# Patient Record
Sex: Female | Born: 1965
Health system: Southern US, Community
[De-identification: ages and names within clinical notes are randomized; demographics above are authoritative.]

## PROBLEM LIST (undated history)

## (undated) DIAGNOSIS — K589 Irritable bowel syndrome without diarrhea: Secondary | ICD-10-CM

## (undated) DIAGNOSIS — E785 Hyperlipidemia, unspecified: Secondary | ICD-10-CM

## (undated) DIAGNOSIS — N390 Urinary tract infection, site not specified: Secondary | ICD-10-CM

## (undated) DIAGNOSIS — G250 Essential tremor: Secondary | ICD-10-CM

## (undated) DIAGNOSIS — K219 Gastro-esophageal reflux disease without esophagitis: Secondary | ICD-10-CM

## (undated) DIAGNOSIS — E7800 Pure hypercholesterolemia, unspecified: Secondary | ICD-10-CM

## (undated) DIAGNOSIS — I6529 Occlusion and stenosis of unspecified carotid artery: Secondary | ICD-10-CM

## (undated) HISTORY — DX: Hyperlipidemia, unspecified: E78.5

## (undated) HISTORY — PX: EYE SURGERY: SHX253

## (undated) HISTORY — PX: BREAST CYST ASPIRATION: SHX578

## (undated) HISTORY — DX: Pure hypercholesterolemia, unspecified: E78.00

## (undated) HISTORY — DX: Gastro-esophageal reflux disease without esophagitis: K21.9

## (undated) HISTORY — DX: Urinary tract infection, site not specified: N39.0

## (undated) HISTORY — DX: Irritable bowel syndrome, unspecified: K58.9

## (undated) HISTORY — DX: Occlusion and stenosis of unspecified carotid artery: I65.29

## (undated) HISTORY — DX: Essential tremor: G25.0

---

## 1998-05-17 ENCOUNTER — Other Ambulatory Visit: Admission: RE | Admit: 1998-05-17 | Discharge: 1998-05-17 | Payer: Self-pay | Admitting: *Deleted

## 1999-09-22 ENCOUNTER — Other Ambulatory Visit: Admission: RE | Admit: 1999-09-22 | Discharge: 1999-09-22 | Payer: Self-pay | Admitting: Obstetrics and Gynecology

## 2000-10-10 ENCOUNTER — Encounter: Payer: Self-pay | Admitting: Obstetrics and Gynecology

## 2000-10-10 ENCOUNTER — Encounter: Admission: RE | Admit: 2000-10-10 | Discharge: 2000-10-10 | Payer: Self-pay | Admitting: Obstetrics and Gynecology

## 2000-11-23 ENCOUNTER — Other Ambulatory Visit: Admission: RE | Admit: 2000-11-23 | Discharge: 2000-11-23 | Payer: Self-pay | Admitting: Obstetrics and Gynecology

## 2001-09-17 ENCOUNTER — Ambulatory Visit (HOSPITAL_COMMUNITY): Admission: RE | Admit: 2001-09-17 | Discharge: 2001-09-17 | Payer: Self-pay | Admitting: Orthopedic Surgery

## 2001-09-17 ENCOUNTER — Encounter: Payer: Self-pay | Admitting: Orthopedic Surgery

## 2001-12-19 ENCOUNTER — Other Ambulatory Visit: Admission: RE | Admit: 2001-12-19 | Discharge: 2001-12-19 | Payer: Self-pay | Admitting: Obstetrics and Gynecology

## 2002-03-21 ENCOUNTER — Encounter: Admission: RE | Admit: 2002-03-21 | Discharge: 2002-03-21 | Payer: Self-pay | Admitting: Obstetrics and Gynecology

## 2002-03-21 ENCOUNTER — Encounter: Payer: Self-pay | Admitting: Obstetrics and Gynecology

## 2003-01-02 ENCOUNTER — Other Ambulatory Visit: Admission: RE | Admit: 2003-01-02 | Discharge: 2003-01-02 | Payer: Self-pay | Admitting: Obstetrics and Gynecology

## 2003-02-03 ENCOUNTER — Encounter: Payer: Self-pay | Admitting: Obstetrics and Gynecology

## 2003-02-03 ENCOUNTER — Encounter: Admission: RE | Admit: 2003-02-03 | Discharge: 2003-02-03 | Payer: Self-pay | Admitting: Obstetrics and Gynecology

## 2004-01-15 ENCOUNTER — Other Ambulatory Visit: Admission: RE | Admit: 2004-01-15 | Discharge: 2004-01-15 | Payer: Self-pay | Admitting: Obstetrics and Gynecology

## 2004-03-29 ENCOUNTER — Encounter: Admission: RE | Admit: 2004-03-29 | Discharge: 2004-03-29 | Payer: Self-pay | Admitting: Internal Medicine

## 2004-04-12 ENCOUNTER — Encounter: Admission: RE | Admit: 2004-04-12 | Discharge: 2004-04-12 | Payer: Self-pay | Admitting: Obstetrics and Gynecology

## 2005-03-17 ENCOUNTER — Other Ambulatory Visit: Admission: RE | Admit: 2005-03-17 | Discharge: 2005-03-17 | Payer: Self-pay | Admitting: Obstetrics and Gynecology

## 2005-04-04 ENCOUNTER — Encounter: Admission: RE | Admit: 2005-04-04 | Discharge: 2005-04-04 | Payer: Self-pay | Admitting: *Deleted

## 2005-05-23 ENCOUNTER — Encounter: Admission: RE | Admit: 2005-05-23 | Discharge: 2005-05-23 | Payer: Self-pay | Admitting: Obstetrics and Gynecology

## 2006-05-28 ENCOUNTER — Encounter: Admission: RE | Admit: 2006-05-28 | Discharge: 2006-05-28 | Payer: Self-pay | Admitting: Obstetrics and Gynecology

## 2007-06-21 ENCOUNTER — Encounter: Admission: RE | Admit: 2007-06-21 | Discharge: 2007-06-21 | Payer: Self-pay | Admitting: Obstetrics and Gynecology

## 2008-09-08 ENCOUNTER — Encounter: Admission: RE | Admit: 2008-09-08 | Discharge: 2008-09-08 | Payer: Self-pay | Admitting: Obstetrics and Gynecology

## 2008-09-28 ENCOUNTER — Encounter: Admission: RE | Admit: 2008-09-28 | Discharge: 2008-09-28 | Payer: Self-pay | Admitting: Obstetrics and Gynecology

## 2009-10-05 ENCOUNTER — Encounter: Admission: RE | Admit: 2009-10-05 | Discharge: 2009-10-05 | Payer: Self-pay | Admitting: Obstetrics and Gynecology

## 2010-10-06 ENCOUNTER — Encounter
Admission: RE | Admit: 2010-10-06 | Discharge: 2010-10-06 | Payer: Self-pay | Source: Home / Self Care | Attending: Obstetrics and Gynecology | Admitting: Obstetrics and Gynecology

## 2010-10-30 ENCOUNTER — Encounter: Payer: Self-pay | Admitting: Obstetrics and Gynecology

## 2011-11-08 ENCOUNTER — Other Ambulatory Visit: Payer: Self-pay | Admitting: Obstetrics and Gynecology

## 2011-11-08 DIAGNOSIS — Z1231 Encounter for screening mammogram for malignant neoplasm of breast: Secondary | ICD-10-CM

## 2011-12-01 ENCOUNTER — Other Ambulatory Visit: Payer: Self-pay | Admitting: Obstetrics and Gynecology

## 2011-12-01 ENCOUNTER — Ambulatory Visit
Admission: RE | Admit: 2011-12-01 | Discharge: 2011-12-01 | Disposition: A | Discharge status: Home / Self Care | Payer: Commercial Indemnity | Source: Ambulatory Visit | Attending: Obstetrics and Gynecology | Admitting: Obstetrics and Gynecology

## 2011-12-01 DIAGNOSIS — N63 Unspecified lump in unspecified breast: Secondary | ICD-10-CM

## 2011-12-01 DIAGNOSIS — Z1231 Encounter for screening mammogram for malignant neoplasm of breast: Secondary | ICD-10-CM

## 2011-12-12 ENCOUNTER — Ambulatory Visit
Admission: RE | Admit: 2011-12-12 | Discharge: 2011-12-12 | Disposition: A | Discharge status: Home / Self Care | Payer: Commercial Indemnity | Source: Ambulatory Visit | Attending: Obstetrics and Gynecology | Admitting: Obstetrics and Gynecology

## 2011-12-12 DIAGNOSIS — N63 Unspecified lump in unspecified breast: Secondary | ICD-10-CM

## 2012-12-06 ENCOUNTER — Other Ambulatory Visit: Payer: Self-pay

## 2012-12-06 DIAGNOSIS — Z1231 Encounter for screening mammogram for malignant neoplasm of breast: Secondary | ICD-10-CM

## 2013-01-03 ENCOUNTER — Ambulatory Visit
Admission: RE | Admit: 2013-01-03 | Discharge: 2013-01-03 | Disposition: A | Discharge status: Home / Self Care | Payer: BC Managed Care – PPO | Source: Ambulatory Visit

## 2013-01-03 DIAGNOSIS — Z1231 Encounter for screening mammogram for malignant neoplasm of breast: Secondary | ICD-10-CM

## 2014-01-28 ENCOUNTER — Other Ambulatory Visit: Payer: Self-pay

## 2014-01-28 DIAGNOSIS — Z803 Family history of malignant neoplasm of breast: Secondary | ICD-10-CM

## 2014-01-28 DIAGNOSIS — Z1231 Encounter for screening mammogram for malignant neoplasm of breast: Secondary | ICD-10-CM

## 2014-03-18 ENCOUNTER — Ambulatory Visit
Admission: RE | Admit: 2014-03-18 | Discharge: 2014-03-18 | Disposition: A | Discharge status: Home / Self Care | Payer: BC Managed Care – PPO | Source: Ambulatory Visit

## 2014-03-18 ENCOUNTER — Other Ambulatory Visit: Payer: Self-pay

## 2014-03-18 DIAGNOSIS — Z1231 Encounter for screening mammogram for malignant neoplasm of breast: Secondary | ICD-10-CM

## 2014-03-18 DIAGNOSIS — Z803 Family history of malignant neoplasm of breast: Secondary | ICD-10-CM

## 2014-12-04 ENCOUNTER — Ambulatory Visit (INDEPENDENT_AMBULATORY_CARE_PROVIDER_SITE_OTHER): Payer: BLUE CROSS/BLUE SHIELD | Admitting: Cardiology

## 2014-12-04 ENCOUNTER — Encounter: Payer: Self-pay | Admitting: Cardiology

## 2014-12-04 VITALS — BP 138/80 | HR 85 | Ht 61.0 in | Wt 128.4 lb

## 2014-12-04 DIAGNOSIS — R002 Palpitations: Secondary | ICD-10-CM | POA: Insufficient documentation

## 2014-12-04 DIAGNOSIS — R0789 Other chest pain: Secondary | ICD-10-CM

## 2014-12-04 NOTE — Progress Notes (Signed)
Cardiology Office Note   Date:  12/04/2014   ID:  Katelyn Cooke 31-Dec-1965, MRN [id number]  PCP:  No primary care provider on file.  Cardiologist:   Minus Breeding, MD   Chief Complaint  Patient presents with  . Chest Pain      History of Present Illness: Katelyn Cooke is a 49 y.o. female who presents for evaluation of palpitations. She has no past cardiac history although her mother had early onset heart disease. She has had some palpitations. These have been going on for about a year. She will occasionally feel her heart beating fast and stronger.  She will sometimes hold her breath or bend over and it will go away. She thinks it's been up to 130 or more beats per minute. She can have this with emotional stress. She can have it at rest unprovoked. She does not bring it on with activities however. She thinks it happens 2-3 times per week and might last for up to an hour at a time. Yesterday she had some left arm and back discomfort with this. She still feels a little discomfort in her posterior neck. However, she is an active person and exercises routinely. She's exercising vigorously recently and does not bring on any chest pressure, neck or arm discomfort. She doesn't have any presyncope or syncope. She has had no shortness of breath. She has had no weight gain or edema.    Past Medical History  Diagnosis Date  . Hyperlipemia   . Essential tremor     Past Surgical History  Procedure Laterality Date  . Eye surgery       Current Outpatient Prescriptions  Medication Sig Dispense Refill  . buPROPion (WELLBUTRIN XL) 300 MG 24 hr tablet Take 300 mg by mouth.    . clonazePAM (KLONOPIN) 0.5 MG tablet Take 0.5 mg by mouth.    . norgestrel-ethinyl estradiol (LO/OVRAL,CRYSELLE) 0.3-30 MG-MCG tablet Take 1 tablet by mouth.    . zolpidem (AMBIEN) 10 MG tablet Take 10 mg by mouth.     No current facility-administered medications for this visit.    Allergies:   Review of  patient's allergies indicates no active allergies.    Social History:  The patient  reports that she has quit smoking. She has never used smokeless tobacco. She reports that she drinks about 6.0 oz of alcohol per week.   Family History:  The patient's family history includes CAD (age of onset: 61) in her mother; Stroke (age of onset: 62) in her father.    ROS:  Please see the history of present illness.   Otherwise, review of systems are positive for none.   All other systems are reviewed and negative.    PHYSICAL EXAM: VS:  BP 138/80 mmHg  Pulse 85  Ht 5\' 1"  (1.549 m)  Wt 128 lb 6.4 oz (58.242 kg)  BMI 24.27 kg/m2 , BMI Body mass index is 24.27 kg/(m^2). GENERAL:  Well appearing HEENT:  Pupils equal round and reactive, fundi not visualized, oral mucosa unremarkable NECK:  No jugular venous distention, waveform within normal limits, carotid upstroke brisk and symmetric, no bruits, no thyromegaly LYMPHATICS:  No cervical, inguinal adenopathy LUNGS:  Clear to auscultation bilaterally BACK:  No CVA tenderness CHEST:  Unremarkable HEART:  PMI not displaced or sustained,S1 and S2 within normal limits, no S3, no S4, no clicks, no rubs, no murmurs ABD:  Flat, positive bowel sounds normal in frequency in pitch, no bruits, no rebound, no  guarding, no midline pulsatile mass, no hepatomegaly, no splenomegaly EXT:  2 plus pulses throughout, no edema, no cyanosis no clubbing SKIN:  No rashes no nodules NEURO:  Cranial nerves II through XII grossly intact, motor grossly intact throughout PSYCH:  Cognitively intact, oriented to person place and time    EKG:  EKG is ordered today. The ekg ordered today demonstrates sinus rhythm, rate 85, axis within normal limits, intervals within normal limits, poor anterior R wave progression, no acute ST-T wave changes.   Recent Labs: No results found for requested labs within last 365 days.    Lipid Panel No results found for: CHOL, TRIG, HDL, CHOLHDL,  VLDL, LDLCALC, LDLDIRECT    Wt Readings from Last 3 Encounters:  12/04/14 128 lb 6.4 oz (58.242 kg)      Other studies Reviewed: Additional studies/ records that were reviewed today include: None.    ASSESSMENT AND PLAN:  Palpitations:  Patient is describing what could be SVT. It could also be sinus tachycardia or less likely other dysrhythmia. She's going to have her thyroid checked when she has labs upcoming in a few weeks. I will start with an event monitor.   EVERLYNN SAGUN will need a 21 day event monitor.  The patients symptoms necessitate an event monitor.  The symptoms are too infrequent to be identified on a Holter monitor.    Arm pain:  This is quite atypical.  She does have risk factors.  If her event monitor is unrevealing I will consider a POET (Plain Old Exercise Treadmill).  I think the pretest probability of obstructive CAD is very low.    Current medicines are reviewed at length with the patient today.  The patient does not have concerns regarding medicines.  The following changes have been made:  no change  Labs/ tests ordered today include: Event recorder  Orders Placed This Encounter  Procedures  . EKG 12-Lead     Disposition:   FU with me after the event recorder.      Signed, Minus Breeding, MD  12/04/2014 4:46 PM    [facility]

## 2014-12-04 NOTE — Patient Instructions (Signed)
Your physician recommends that you schedule a follow-up appointment in: as needed with Dr. Ludwig Clarks your monitor for the time discussed

## 2014-12-17 ENCOUNTER — Encounter: Payer: Self-pay | Admitting: Cardiology

## 2014-12-23 ENCOUNTER — Other Ambulatory Visit: Payer: Self-pay | Admitting: *Deleted

## 2014-12-23 DIAGNOSIS — E785 Hyperlipidemia, unspecified: Secondary | ICD-10-CM

## 2014-12-29 ENCOUNTER — Other Ambulatory Visit: Payer: Self-pay | Admitting: *Deleted

## 2014-12-29 DIAGNOSIS — R0789 Other chest pain: Secondary | ICD-10-CM

## 2015-03-18 ENCOUNTER — Other Ambulatory Visit: Payer: Self-pay

## 2015-03-18 DIAGNOSIS — Z1231 Encounter for screening mammogram for malignant neoplasm of breast: Secondary | ICD-10-CM

## 2015-04-16 ENCOUNTER — Ambulatory Visit
Admission: RE | Admit: 2015-04-16 | Discharge: 2015-04-16 | Disposition: A | Discharge status: Home / Self Care | Payer: BLUE CROSS/BLUE SHIELD | Source: Ambulatory Visit

## 2015-04-16 DIAGNOSIS — Z1231 Encounter for screening mammogram for malignant neoplasm of breast: Secondary | ICD-10-CM

## 2016-01-14 DIAGNOSIS — N959 Unspecified menopausal and perimenopausal disorder: Secondary | ICD-10-CM | POA: Diagnosis not present

## 2016-01-14 DIAGNOSIS — F39 Unspecified mood [affective] disorder: Secondary | ICD-10-CM | POA: Diagnosis not present

## 2016-02-16 DIAGNOSIS — H40023 Open angle with borderline findings, high risk, bilateral: Secondary | ICD-10-CM | POA: Diagnosis not present

## 2016-02-16 DIAGNOSIS — D3131 Benign neoplasm of right choroid: Secondary | ICD-10-CM | POA: Diagnosis not present

## 2016-02-16 DIAGNOSIS — H2513 Age-related nuclear cataract, bilateral: Secondary | ICD-10-CM | POA: Diagnosis not present

## 2016-02-16 DIAGNOSIS — H25013 Cortical age-related cataract, bilateral: Secondary | ICD-10-CM | POA: Diagnosis not present

## 2016-02-16 DIAGNOSIS — H04123 Dry eye syndrome of bilateral lacrimal glands: Secondary | ICD-10-CM | POA: Diagnosis not present

## 2016-04-10 DIAGNOSIS — D1801 Hemangioma of skin and subcutaneous tissue: Secondary | ICD-10-CM | POA: Diagnosis not present

## 2016-04-10 DIAGNOSIS — L821 Other seborrheic keratosis: Secondary | ICD-10-CM | POA: Diagnosis not present

## 2016-04-10 DIAGNOSIS — L738 Other specified follicular disorders: Secondary | ICD-10-CM | POA: Diagnosis not present

## 2016-04-10 DIAGNOSIS — D2261 Melanocytic nevi of right upper limb, including shoulder: Secondary | ICD-10-CM | POA: Diagnosis not present

## 2016-05-26 DIAGNOSIS — Z01419 Encounter for gynecological examination (general) (routine) without abnormal findings: Secondary | ICD-10-CM | POA: Diagnosis not present

## 2016-05-26 DIAGNOSIS — N760 Acute vaginitis: Secondary | ICD-10-CM | POA: Diagnosis not present

## 2016-05-26 DIAGNOSIS — Z6825 Body mass index (BMI) 25.0-25.9, adult: Secondary | ICD-10-CM | POA: Diagnosis not present

## 2016-05-26 DIAGNOSIS — N390 Urinary tract infection, site not specified: Secondary | ICD-10-CM | POA: Diagnosis not present

## 2016-07-14 ENCOUNTER — Other Ambulatory Visit: Payer: Self-pay | Admitting: Obstetrics and Gynecology

## 2016-07-14 DIAGNOSIS — Z1231 Encounter for screening mammogram for malignant neoplasm of breast: Secondary | ICD-10-CM

## 2016-07-28 ENCOUNTER — Ambulatory Visit
Admission: RE | Admit: 2016-07-28 | Discharge: 2016-07-28 | Disposition: A | Discharge status: Home / Self Care | Payer: BLUE CROSS/BLUE SHIELD | Source: Ambulatory Visit | Attending: Obstetrics and Gynecology | Admitting: Obstetrics and Gynecology

## 2016-07-28 DIAGNOSIS — Z1231 Encounter for screening mammogram for malignant neoplasm of breast: Secondary | ICD-10-CM | POA: Diagnosis not present

## 2016-07-31 DIAGNOSIS — J069 Acute upper respiratory infection, unspecified: Secondary | ICD-10-CM | POA: Diagnosis not present

## 2016-07-31 DIAGNOSIS — R509 Fever, unspecified: Secondary | ICD-10-CM | POA: Diagnosis not present

## 2016-09-29 DIAGNOSIS — K582 Mixed irritable bowel syndrome: Secondary | ICD-10-CM | POA: Diagnosis not present

## 2016-09-29 DIAGNOSIS — K644 Residual hemorrhoidal skin tags: Secondary | ICD-10-CM | POA: Diagnosis not present

## 2016-11-08 DIAGNOSIS — F419 Anxiety disorder, unspecified: Secondary | ICD-10-CM | POA: Diagnosis not present

## 2016-11-08 DIAGNOSIS — G44209 Tension-type headache, unspecified, not intractable: Secondary | ICD-10-CM | POA: Diagnosis not present

## 2016-11-08 DIAGNOSIS — R030 Elevated blood-pressure reading, without diagnosis of hypertension: Secondary | ICD-10-CM | POA: Diagnosis not present

## 2016-11-21 DIAGNOSIS — H8112 Benign paroxysmal vertigo, left ear: Secondary | ICD-10-CM | POA: Diagnosis not present

## 2016-11-21 DIAGNOSIS — R030 Elevated blood-pressure reading, without diagnosis of hypertension: Secondary | ICD-10-CM | POA: Diagnosis not present

## 2017-02-16 DIAGNOSIS — F39 Unspecified mood [affective] disorder: Secondary | ICD-10-CM | POA: Diagnosis not present

## 2017-02-16 DIAGNOSIS — E7800 Pure hypercholesterolemia, unspecified: Secondary | ICD-10-CM | POA: Diagnosis not present

## 2017-02-16 DIAGNOSIS — G4700 Insomnia, unspecified: Secondary | ICD-10-CM | POA: Diagnosis not present

## 2017-02-16 DIAGNOSIS — N959 Unspecified menopausal and perimenopausal disorder: Secondary | ICD-10-CM | POA: Diagnosis not present

## 2017-02-16 DIAGNOSIS — Z131 Encounter for screening for diabetes mellitus: Secondary | ICD-10-CM | POA: Diagnosis not present

## 2017-02-16 DIAGNOSIS — Z0000 Encounter for general adult medical examination without abnormal findings: Secondary | ICD-10-CM | POA: Diagnosis not present

## 2017-03-21 DIAGNOSIS — H25013 Cortical age-related cataract, bilateral: Secondary | ICD-10-CM | POA: Diagnosis not present

## 2017-03-21 DIAGNOSIS — H2513 Age-related nuclear cataract, bilateral: Secondary | ICD-10-CM | POA: Diagnosis not present

## 2017-03-21 DIAGNOSIS — D3131 Benign neoplasm of right choroid: Secondary | ICD-10-CM | POA: Diagnosis not present

## 2017-03-21 DIAGNOSIS — H40023 Open angle with borderline findings, high risk, bilateral: Secondary | ICD-10-CM | POA: Diagnosis not present

## 2017-04-12 IMAGING — MG 2D DIGITAL SCREENING BILATERAL MAMMOGRAM WITH CAD AND ADJUNCT TO
9 of 12 series · 9 of 28 positions shown · non-contrast
Comparison: Previous exam(s).

CLINICAL DATA: Screening.

EXAM:
2D DIGITAL SCREENING BILATERAL MAMMOGRAM WITH CAD AND ADJUNCT TOMO

[L CC synth-2D]
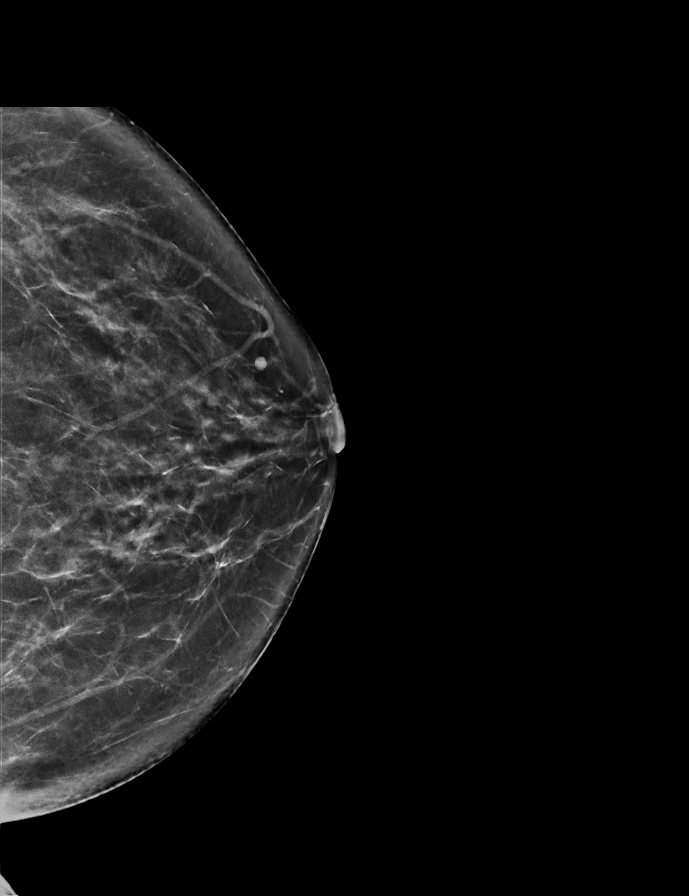

[R MLO]
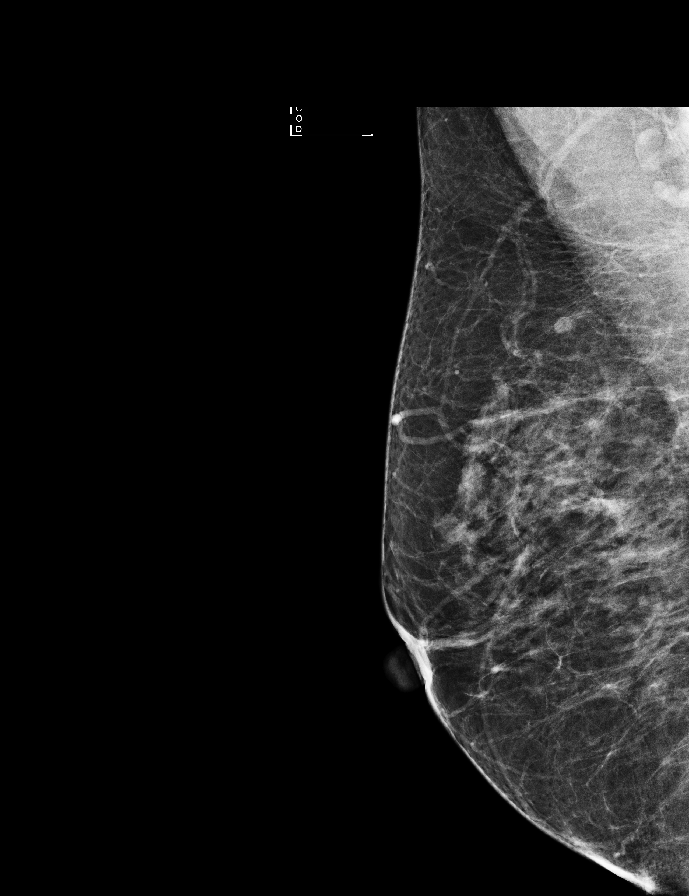

[R CC]
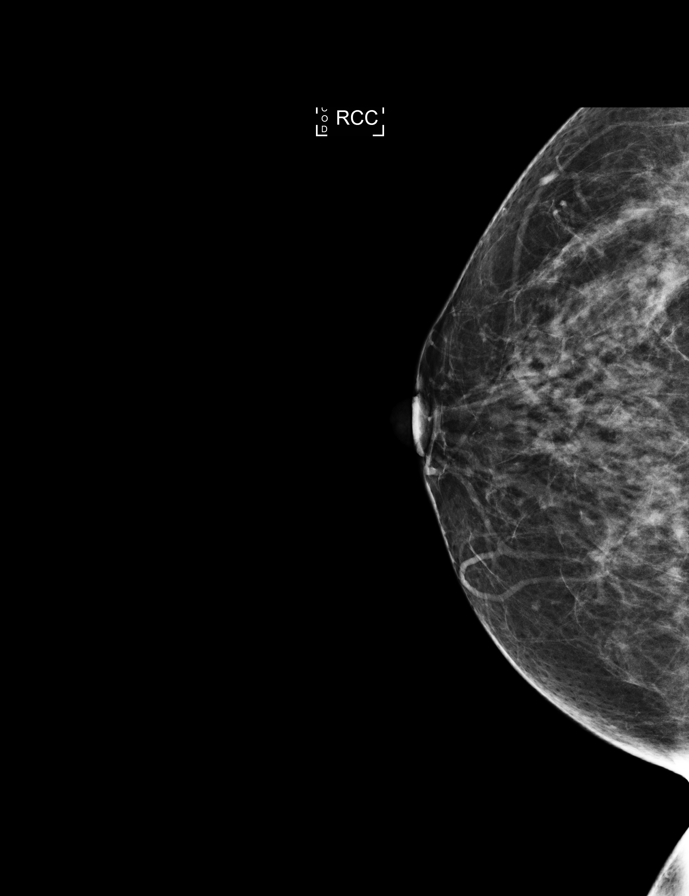

[L MLO synth-2D]
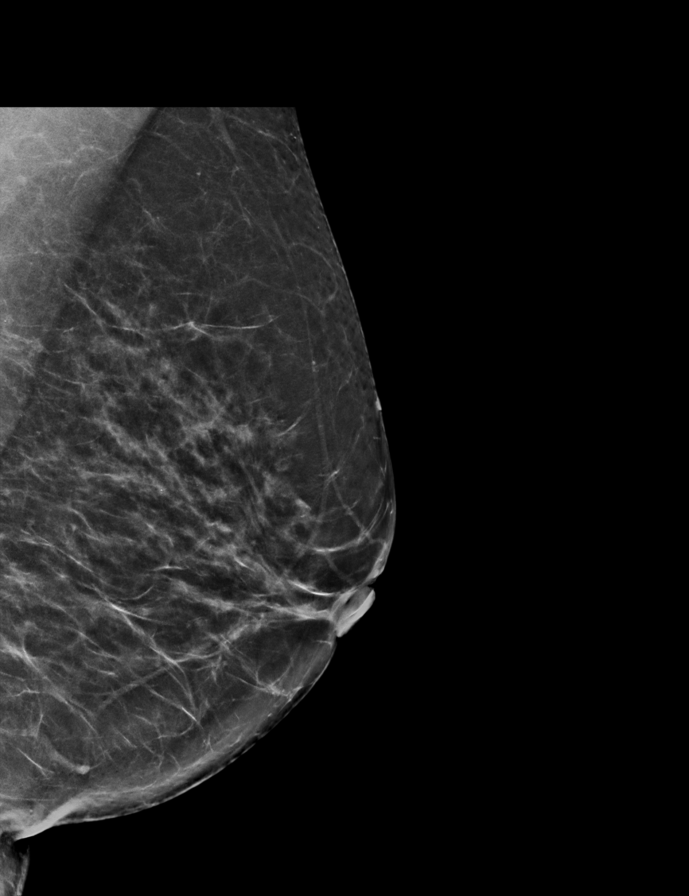

[R MLO synth-2D]
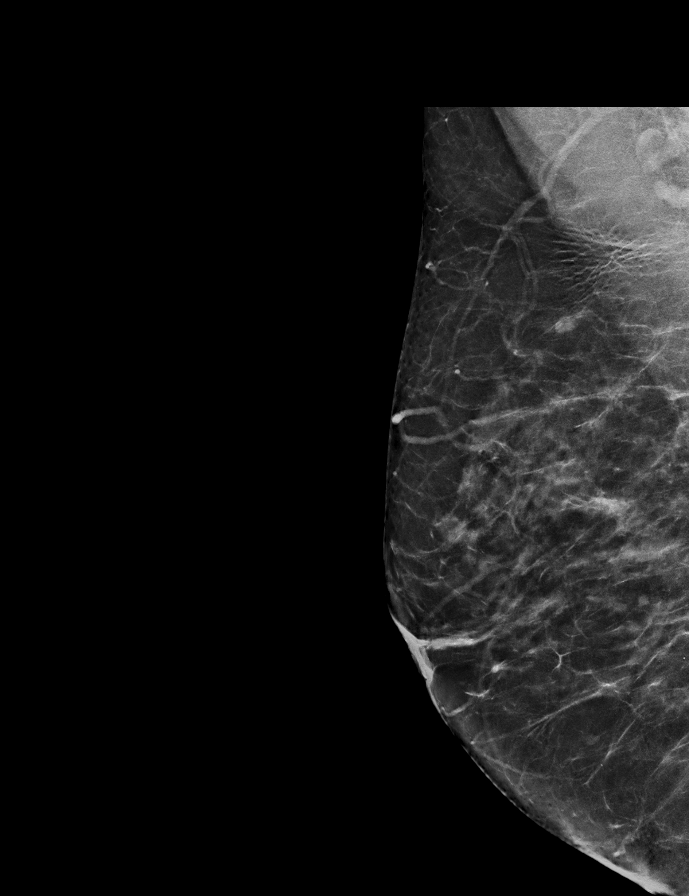

[L MLO]
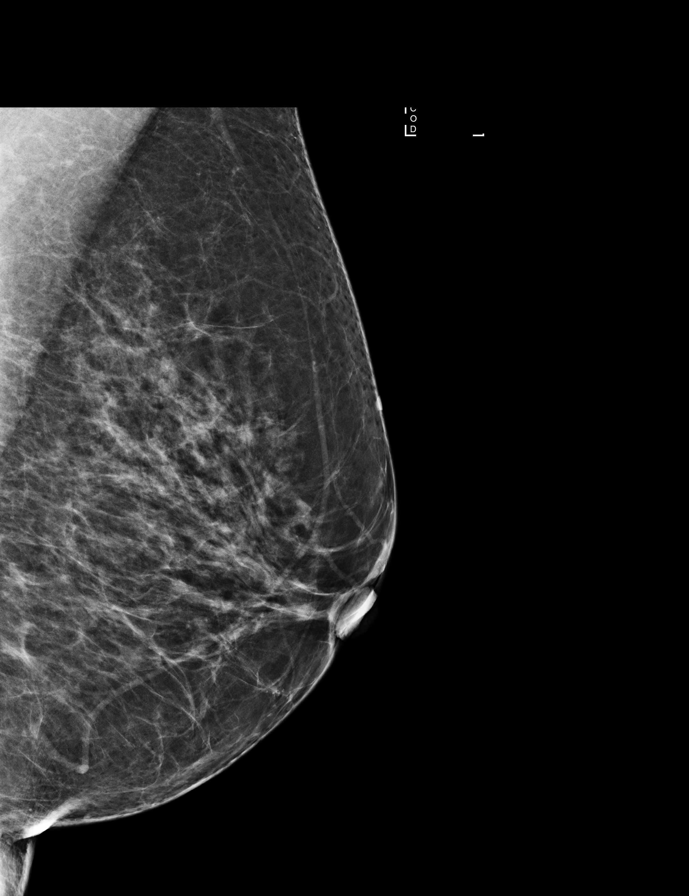

[R CC synth-2D]
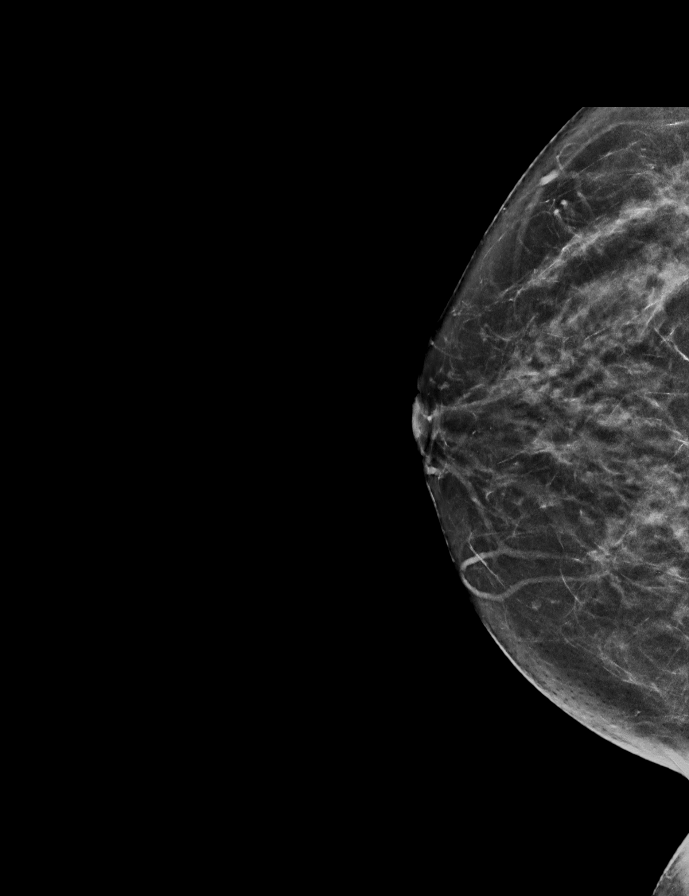

[L CC]
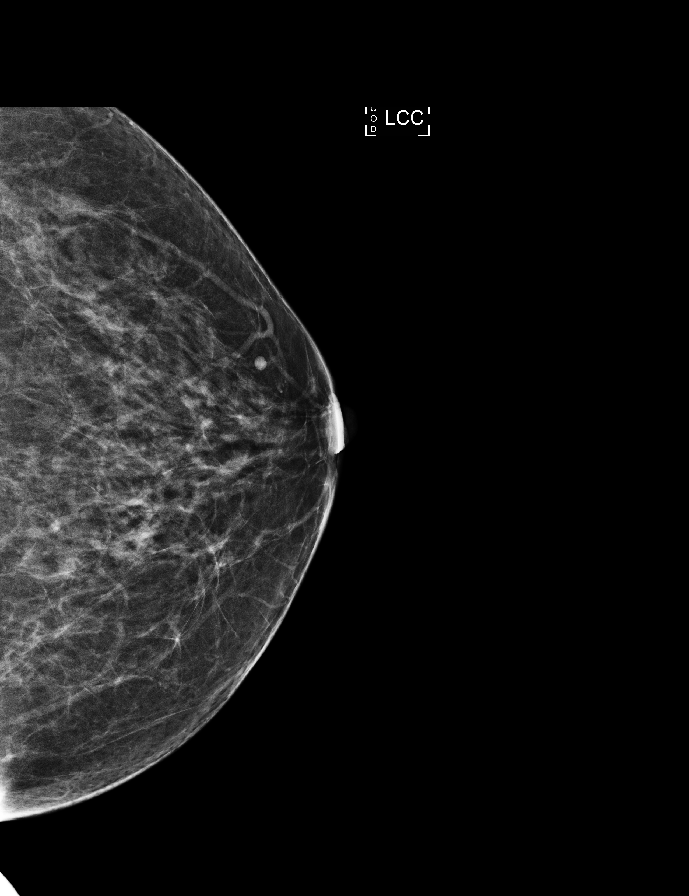

[R CC tomo · tomo slice 32/63.0]
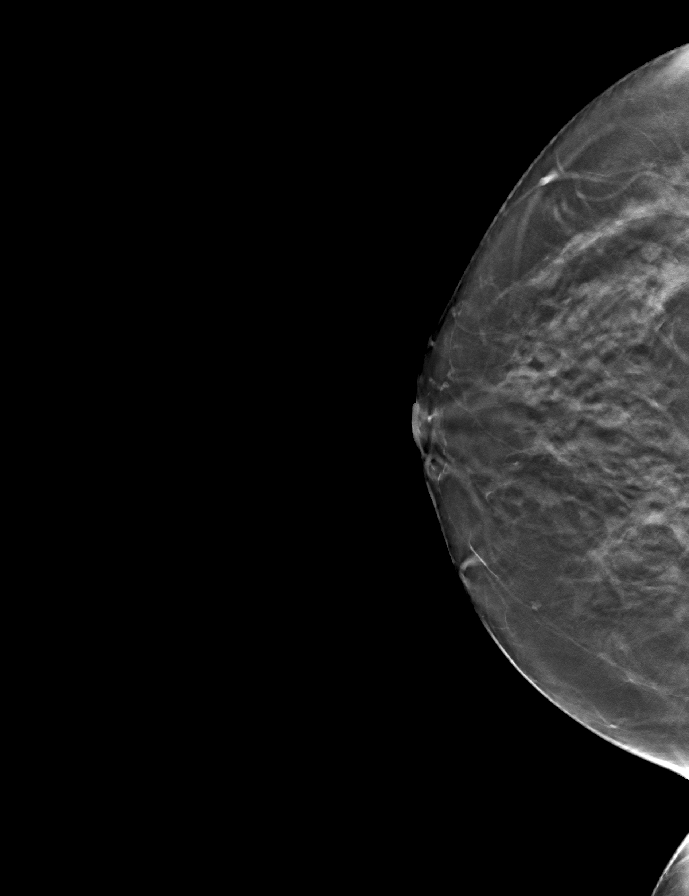

[9 of 28 positions shown; findings below may reference images not displayed]

ACR Breast Density Category b: There are scattered areas of
fibroglandular density.
FINDINGS: There are no findings suspicious for malignancy. Images were
processed with CAD.
IMPRESSION: No mammographic evidence of malignancy. A result letter of this
screening mammogram will be mailed directly to the patient.

RECOMMENDATION:
Screening mammogram in one year. (Code:97-6-RS4)

BI-RADS CATEGORY  1: Negative.

## 2017-04-19 DIAGNOSIS — R5383 Other fatigue: Secondary | ICD-10-CM | POA: Diagnosis not present

## 2017-04-19 DIAGNOSIS — N959 Unspecified menopausal and perimenopausal disorder: Secondary | ICD-10-CM | POA: Diagnosis not present

## 2017-05-30 DIAGNOSIS — Z01419 Encounter for gynecological examination (general) (routine) without abnormal findings: Secondary | ICD-10-CM | POA: Diagnosis not present

## 2017-05-30 DIAGNOSIS — Z6825 Body mass index (BMI) 25.0-25.9, adult: Secondary | ICD-10-CM | POA: Diagnosis not present

## 2017-06-05 DIAGNOSIS — F438 Other reactions to severe stress: Secondary | ICD-10-CM | POA: Diagnosis not present

## 2017-06-06 DIAGNOSIS — L814 Other melanin hyperpigmentation: Secondary | ICD-10-CM | POA: Diagnosis not present

## 2017-06-06 DIAGNOSIS — L738 Other specified follicular disorders: Secondary | ICD-10-CM | POA: Diagnosis not present

## 2017-06-06 DIAGNOSIS — L819 Disorder of pigmentation, unspecified: Secondary | ICD-10-CM | POA: Diagnosis not present

## 2017-06-06 DIAGNOSIS — D692 Other nonthrombocytopenic purpura: Secondary | ICD-10-CM | POA: Diagnosis not present

## 2017-07-02 DIAGNOSIS — F438 Other reactions to severe stress: Secondary | ICD-10-CM | POA: Diagnosis not present

## 2017-07-27 ENCOUNTER — Other Ambulatory Visit: Payer: Self-pay | Admitting: Obstetrics and Gynecology

## 2017-07-27 DIAGNOSIS — Z139 Encounter for screening, unspecified: Secondary | ICD-10-CM

## 2017-08-03 ENCOUNTER — Ambulatory Visit
Admission: RE | Admit: 2017-08-03 | Discharge: 2017-08-03 | Disposition: A | Discharge status: Home / Self Care | Payer: BLUE CROSS/BLUE SHIELD | Source: Ambulatory Visit | Attending: Obstetrics and Gynecology | Admitting: Obstetrics and Gynecology

## 2017-08-03 ENCOUNTER — Encounter: Payer: Self-pay | Admitting: Radiology

## 2017-08-03 DIAGNOSIS — Z139 Encounter for screening, unspecified: Secondary | ICD-10-CM

## 2017-08-03 DIAGNOSIS — Z1231 Encounter for screening mammogram for malignant neoplasm of breast: Secondary | ICD-10-CM | POA: Diagnosis not present

## 2017-09-04 DIAGNOSIS — N921 Excessive and frequent menstruation with irregular cycle: Secondary | ICD-10-CM | POA: Diagnosis not present

## 2017-09-04 DIAGNOSIS — N858 Other specified noninflammatory disorders of uterus: Secondary | ICD-10-CM | POA: Diagnosis not present

## 2017-09-14 DIAGNOSIS — N920 Excessive and frequent menstruation with regular cycle: Secondary | ICD-10-CM | POA: Diagnosis not present

## 2017-09-24 DIAGNOSIS — M549 Dorsalgia, unspecified: Secondary | ICD-10-CM | POA: Diagnosis not present

## 2017-09-24 DIAGNOSIS — R1084 Generalized abdominal pain: Secondary | ICD-10-CM | POA: Diagnosis not present

## 2017-10-06 DIAGNOSIS — J019 Acute sinusitis, unspecified: Secondary | ICD-10-CM | POA: Diagnosis not present

## 2017-10-06 DIAGNOSIS — R05 Cough: Secondary | ICD-10-CM | POA: Diagnosis not present

## 2017-10-06 DIAGNOSIS — J029 Acute pharyngitis, unspecified: Secondary | ICD-10-CM | POA: Diagnosis not present

## 2017-11-02 DIAGNOSIS — F438 Other reactions to severe stress: Secondary | ICD-10-CM | POA: Diagnosis not present

## 2017-11-05 DIAGNOSIS — N920 Excessive and frequent menstruation with regular cycle: Secondary | ICD-10-CM | POA: Diagnosis not present

## 2017-11-16 DIAGNOSIS — F438 Other reactions to severe stress: Secondary | ICD-10-CM | POA: Diagnosis not present

## 2017-12-06 DIAGNOSIS — F438 Other reactions to severe stress: Secondary | ICD-10-CM | POA: Diagnosis not present

## 2017-12-28 DIAGNOSIS — F438 Other reactions to severe stress: Secondary | ICD-10-CM | POA: Diagnosis not present

## 2018-01-21 ENCOUNTER — Ambulatory Visit (INDEPENDENT_AMBULATORY_CARE_PROVIDER_SITE_OTHER): Payer: BLUE CROSS/BLUE SHIELD

## 2018-01-21 ENCOUNTER — Ambulatory Visit (INDEPENDENT_AMBULATORY_CARE_PROVIDER_SITE_OTHER): Payer: BLUE CROSS/BLUE SHIELD | Admitting: Orthopedic Surgery

## 2018-01-21 ENCOUNTER — Encounter (INDEPENDENT_AMBULATORY_CARE_PROVIDER_SITE_OTHER): Payer: Self-pay | Admitting: Orthopedic Surgery

## 2018-01-21 DIAGNOSIS — S838X1A Sprain of other specified parts of right knee, initial encounter: Secondary | ICD-10-CM | POA: Diagnosis not present

## 2018-01-21 DIAGNOSIS — M25561 Pain in right knee: Secondary | ICD-10-CM | POA: Diagnosis not present

## 2018-01-21 NOTE — Progress Notes (Signed)
Office Visit Note   Patient: Katelyn Cooke           Date of Birth: 1966/07/26           MRN: [id number] Visit Date: 01/21/2018 Requested by: Marylynn Pearson, [address], [place], [place] [address] PCP: Marylynn Pearson, MD  Subjective: Chief Complaint  Patient presents with  . Right Knee - Pain    HPI: Katelyn Cooke is a 52 year old patient with right knee pain.  Started over 3 weeks ago when she was playing tennis.  Had a pivoting type injury and developed swelling.  She is been having mechanical symptoms and focal medial knee pain since that time.  Got worse when she was moving furniture.  She describes swelling and catching as well as clicking and popping.  It does hurt her to walk.  When she twists the knee she does not develop pain medially.  She is having for vacation in a week.  She has had no prior surgery or injury to the right knee.  She has not been using any braces.              ROS: All systems reviewed are negative as they relate to the chief complaint within the history of present illness.  Patient denies  fevers or chills.   Assessment & Plan: Visit Diagnoses:  1. Right knee pain, unspecified chronicity   2. Injury of meniscus of right knee, initial encounter     Plan: Impression is right knee pain with mild effusion and medial joint line tenderness.  Meniscal pathology likely.  Plan is for MRI right knee to evaluate medial meniscal tear.  Continue with Advil.  Brace which is a sleeve is provided.  Avoid running and loadbearing activities.  I think stationary bike would be fine.  Follow-up with me after the scan.  Follow-Up Instructions: Return for after MRI.   Orders:  Orders Placed This Encounter  Procedures  . XR KNEE 3 VIEW RIGHT  . MR Knee Right w/o contrast   No orders of the defined types were placed in this encounter.     Procedures: No procedures performed   Clinical Data: No additional findings.  Objective: Vital Signs: There  were no vitals taken for this visit.  Physical Exam:   Constitutional: Patient appears well-developed HEENT:  Head: Normocephalic Eyes:EOM are normal Neck: Normal range of motion Cardiovascular: Normal rate Pulmonary/chest: Effort normal Neurologic: Patient is alert Skin: Skin is warm Psychiatric: Patient has normal mood and affect    Ortho Exam: Orthopedic exam demonstrates normal gait and alignment.  Excellent muscle tone in both legs.  No quad atrophy is noted.  Mild effusion noted in the right knee no effusion in the left knee.  Pedal pulses palpable.  No groin pain with internal/external rotation of either leg.  Negative patellar apprehension.  Medial joint line tenderness is present particularly posterior medial on the right-hand side.  Collateral cruciate ligaments stable on the right.  Extensor mechanism nontender.  Specialty Comments:  No specialty comments available.  Imaging: Xr Knee 3 View Right  Result Date: 01/21/2018 AP lateral merchant right knee reviewed.  No arthritis is present.  No loose bodies fracture or dislocation seen.  Normal right knee    PMFS History: Patient Active Problem List   Diagnosis Date Noted  . Palpitations 12/04/2014   Past Medical History:  Diagnosis Date  . Essential tremor   . Hyperlipemia     Family History  Problem  Relation Age of Onset  . CAD Mother 9       Stent  . Stroke Father 43    Past Surgical History:  Procedure Laterality Date  . EYE SURGERY     Social History   Occupational History  . Not on file  Tobacco Use  . Smoking status: Former Research scientist (life sciences)  . Smokeless tobacco: Never Used  . Tobacco comment: quit smoking 5 years ago  Substance and Sexual Activity  . Alcohol use: Yes    Alcohol/week: 6.0 oz    Types: 10 Standard drinks or equivalent per week  . Drug use: Not on file  . Sexual activity: Not on file

## 2018-01-22 DIAGNOSIS — M25561 Pain in right knee: Secondary | ICD-10-CM | POA: Diagnosis not present

## 2018-01-23 ENCOUNTER — Ambulatory Visit (INDEPENDENT_AMBULATORY_CARE_PROVIDER_SITE_OTHER): Payer: BLUE CROSS/BLUE SHIELD | Admitting: Orthopedic Surgery

## 2018-01-23 ENCOUNTER — Encounter (INDEPENDENT_AMBULATORY_CARE_PROVIDER_SITE_OTHER): Payer: Self-pay | Admitting: Orthopedic Surgery

## 2018-01-23 DIAGNOSIS — S838X1D Sprain of other specified parts of right knee, subsequent encounter: Secondary | ICD-10-CM | POA: Diagnosis not present

## 2018-01-26 ENCOUNTER — Encounter (INDEPENDENT_AMBULATORY_CARE_PROVIDER_SITE_OTHER): Payer: Self-pay | Admitting: Orthopedic Surgery

## 2018-01-26 DIAGNOSIS — S838X1D Sprain of other specified parts of right knee, subsequent encounter: Secondary | ICD-10-CM

## 2018-01-26 MED ORDER — LIDOCAINE HCL 1 % IJ SOLN
5.0000 mL | INTRAMUSCULAR | Status: AC | PRN
  Administered 2018-01-26: 5 mL

## 2018-01-26 MED ORDER — BUPIVACAINE HCL 0.25 % IJ SOLN
4.0000 mL | INTRAMUSCULAR | Status: AC | PRN
  Administered 2018-01-26: 4 mL via INTRA_ARTICULAR

## 2018-01-26 MED ORDER — METHYLPREDNISOLONE ACETATE 40 MG/ML IJ SUSP
40.0000 mg | INTRAMUSCULAR | Status: AC | PRN
  Administered 2018-01-26: 40 mg via INTRA_ARTICULAR

## 2018-01-26 NOTE — Progress Notes (Signed)
Office Visit Note   Patient: Katelyn Cooke           Date of Birth: 1966/03/28           MRN: [id number] Visit Date: 01/23/2018 Requested by: Katelyn Cooke, [address], [place], [place] [address] PCP: Katelyn Pearson, MD  Subjective: Chief Complaint  Patient presents with  . Right Knee - Follow-up    HPI: Katelyn Cooke is a patient with right knee pain.  Since I have last seen her she has had an MRI scan.  MRI scan is reviewed with the patient today.  She does have a horizontal cleavage tear of the medial meniscus with a small associated cyst.  This indicates likely chronic nature of the tear.  Trace effusion in the knee is present.  No real arthritis in any of the compartments of the knee.  Ligaments intact.  She is going to [place] next week.  She is having a lot of pain with twisting activities.  She took Advil last night for the first time.  She is able to walk most of the time without symptoms.  Patient plays tennis.  She is planning to use a golf cart when she is on vacation.              ROS: All systems reviewed are negative as they relate to the chief complaint within the history of present illness.  Patient denies  fevers or chills.   Assessment & Plan: Visit Diagnoses:  1. Injury of meniscus of right knee, subsequent encounter     Plan: Impression is likely sporadically symptomatic medial meniscal tear in the right knee.  This does look like it has been a chronic degenerative tear which likely became symptomatic when she was moving mattresses several weeks ago.  Plan at this time is to inject the knee to see if we can calm it down.  She is having some occasional mechanical symptoms.  I think it is likely she will come to surgery at some time in the future.  Plan at this time is to inject the knee today and see her back in about a month.  We can decide then for or against arthroscopic intervention depending on how she feels.  I think at this time the decision for  any type of intervention is primarily clinical.  Follow-Up Instructions: Return in about 1 month (around 02/20/2018).   Orders:  No orders of the defined types were placed in this encounter.  No orders of the defined types were placed in this encounter.     Procedures: Large Joint Inj: R knee on 01/26/2018 11:57 AM Indications: diagnostic evaluation, joint swelling and pain Details: 18 G 1.5 in needle, superolateral approach  Arthrogram: No  Medications: 5 mL lidocaine 1 %; 40 mg methylPREDNISolone acetate 40 MG/ML; 4 mL bupivacaine 0.25 % Outcome: tolerated well, no immediate complications Procedure, treatment alternatives, risks and benefits explained, specific risks discussed. Consent was given by the patient. Immediately prior to procedure a time out was called to verify the correct patient, procedure, equipment, support staff and site/side marked as required. Patient was prepped and draped in the usual sterile fashion.       Clinical Data: No additional findings.  Objective: Vital Signs: There were no vitals taken for this visit.  Physical Exam:   Constitutional: Patient appears well-developed HEENT:  Head: Normocephalic Eyes:EOM are normal Neck: Normal range of motion Cardiovascular: Normal rate Pulmonary/chest: Effort normal Neurologic: Patient is alert  Skin: Skin is warm Psychiatric: Patient has normal mood and affect    Ortho Exam: Orthopedic exam demonstrates normal gait alignment no real effusion in the knee today compared to last clinic visit.  Patient does have some medial joint line tenderness and positive McMurray compression testing.  Collateral cruciate ligaments are stable.  No other masses lymphadenopathy or skin changes noted in that right knee region.  Specialty Comments:  No specialty comments available.  Imaging: No results found.   PMFS History: Patient Active Problem List   Diagnosis Date Noted  . Palpitations 12/04/2014   Past  Medical History:  Diagnosis Date  . Essential tremor   . Hyperlipemia     Family History  Problem Relation Age of Onset  . CAD Mother 54       Stent  . Stroke Father 80    Past Surgical History:  Procedure Laterality Date  . EYE SURGERY     Social History   Occupational History  . Not on file  Tobacco Use  . Smoking status: Former Research scientist (life sciences)  . Smokeless tobacco: Never Used  . Tobacco comment: quit smoking 5 years ago  Substance and Sexual Activity  . Alcohol use: Yes    Alcohol/week: 6.0 oz    Types: 10 Standard drinks or equivalent per week  . Drug use: Not on file  . Sexual activity: Not on file

## 2018-02-22 DIAGNOSIS — Z0000 Encounter for general adult medical examination without abnormal findings: Secondary | ICD-10-CM | POA: Diagnosis not present

## 2018-02-22 DIAGNOSIS — E7800 Pure hypercholesterolemia, unspecified: Secondary | ICD-10-CM | POA: Diagnosis not present

## 2018-02-22 DIAGNOSIS — Z131 Encounter for screening for diabetes mellitus: Secondary | ICD-10-CM | POA: Diagnosis not present

## 2018-02-22 DIAGNOSIS — N959 Unspecified menopausal and perimenopausal disorder: Secondary | ICD-10-CM | POA: Diagnosis not present

## 2018-02-28 DIAGNOSIS — N920 Excessive and frequent menstruation with regular cycle: Secondary | ICD-10-CM | POA: Diagnosis not present

## 2018-03-26 DIAGNOSIS — D485 Neoplasm of uncertain behavior of skin: Secondary | ICD-10-CM | POA: Diagnosis not present

## 2018-03-26 DIAGNOSIS — B079 Viral wart, unspecified: Secondary | ICD-10-CM | POA: Diagnosis not present

## 2018-03-29 DIAGNOSIS — L814 Other melanin hyperpigmentation: Secondary | ICD-10-CM | POA: Diagnosis not present

## 2018-03-29 DIAGNOSIS — L821 Other seborrheic keratosis: Secondary | ICD-10-CM | POA: Diagnosis not present

## 2018-03-29 DIAGNOSIS — L570 Actinic keratosis: Secondary | ICD-10-CM | POA: Diagnosis not present

## 2018-03-29 DIAGNOSIS — L720 Epidermal cyst: Secondary | ICD-10-CM | POA: Diagnosis not present

## 2018-03-29 DIAGNOSIS — L738 Other specified follicular disorders: Secondary | ICD-10-CM | POA: Diagnosis not present

## 2018-04-04 DIAGNOSIS — D3131 Benign neoplasm of right choroid: Secondary | ICD-10-CM | POA: Diagnosis not present

## 2018-04-04 DIAGNOSIS — H25013 Cortical age-related cataract, bilateral: Secondary | ICD-10-CM | POA: Diagnosis not present

## 2018-04-04 DIAGNOSIS — H2513 Age-related nuclear cataract, bilateral: Secondary | ICD-10-CM | POA: Diagnosis not present

## 2018-04-04 DIAGNOSIS — H40023 Open angle with borderline findings, high risk, bilateral: Secondary | ICD-10-CM | POA: Diagnosis not present

## 2018-05-24 DIAGNOSIS — R350 Frequency of micturition: Secondary | ICD-10-CM | POA: Diagnosis not present

## 2018-05-24 DIAGNOSIS — N760 Acute vaginitis: Secondary | ICD-10-CM | POA: Diagnosis not present

## 2018-07-17 DIAGNOSIS — R350 Frequency of micturition: Secondary | ICD-10-CM | POA: Diagnosis not present

## 2018-07-17 DIAGNOSIS — R300 Dysuria: Secondary | ICD-10-CM | POA: Diagnosis not present

## 2018-08-08 ENCOUNTER — Other Ambulatory Visit: Payer: Self-pay | Admitting: Obstetrics and Gynecology

## 2018-08-08 DIAGNOSIS — Z1231 Encounter for screening mammogram for malignant neoplasm of breast: Secondary | ICD-10-CM

## 2018-09-20 ENCOUNTER — Ambulatory Visit
Admission: RE | Admit: 2018-09-20 | Discharge: 2018-09-20 | Disposition: A | Discharge status: Home / Self Care | Payer: BLUE CROSS/BLUE SHIELD | Source: Ambulatory Visit | Attending: Obstetrics and Gynecology | Admitting: Obstetrics and Gynecology

## 2018-09-20 DIAGNOSIS — Z1231 Encounter for screening mammogram for malignant neoplasm of breast: Secondary | ICD-10-CM

## 2018-10-11 ENCOUNTER — Encounter (INDEPENDENT_AMBULATORY_CARE_PROVIDER_SITE_OTHER): Payer: BLUE CROSS/BLUE SHIELD | Admitting: Ophthalmology

## 2018-11-11 DIAGNOSIS — R300 Dysuria: Secondary | ICD-10-CM | POA: Diagnosis not present

## 2018-11-11 DIAGNOSIS — N390 Urinary tract infection, site not specified: Secondary | ICD-10-CM | POA: Diagnosis not present

## 2018-11-11 DIAGNOSIS — R319 Hematuria, unspecified: Secondary | ICD-10-CM | POA: Diagnosis not present

## 2018-11-11 DIAGNOSIS — R350 Frequency of micturition: Secondary | ICD-10-CM | POA: Diagnosis not present

## 2018-12-30 DIAGNOSIS — Z809 Family history of malignant neoplasm, unspecified: Secondary | ICD-10-CM | POA: Diagnosis not present

## 2018-12-30 DIAGNOSIS — Z9189 Other specified personal risk factors, not elsewhere classified: Secondary | ICD-10-CM | POA: Diagnosis not present

## 2019-03-14 DIAGNOSIS — Z0000 Encounter for general adult medical examination without abnormal findings: Secondary | ICD-10-CM | POA: Diagnosis not present

## 2019-03-14 DIAGNOSIS — E7800 Pure hypercholesterolemia, unspecified: Secondary | ICD-10-CM | POA: Diagnosis not present

## 2019-04-09 ENCOUNTER — Other Ambulatory Visit: Payer: Self-pay | Admitting: Obstetrics and Gynecology

## 2019-04-09 DIAGNOSIS — N644 Mastodynia: Secondary | ICD-10-CM

## 2019-04-18 ENCOUNTER — Other Ambulatory Visit: Payer: BLUE CROSS/BLUE SHIELD

## 2019-05-02 ENCOUNTER — Ambulatory Visit
Admission: RE | Admit: 2019-05-02 | Discharge: 2019-05-02 | Disposition: A | Discharge status: Home / Self Care | Payer: BC Managed Care – PPO | Source: Ambulatory Visit | Attending: Obstetrics and Gynecology | Admitting: Obstetrics and Gynecology

## 2019-05-02 ENCOUNTER — Other Ambulatory Visit: Payer: Self-pay

## 2019-05-02 ENCOUNTER — Ambulatory Visit: Payer: Self-pay

## 2019-05-02 DIAGNOSIS — N644 Mastodynia: Secondary | ICD-10-CM

## 2019-05-02 DIAGNOSIS — R922 Inconclusive mammogram: Secondary | ICD-10-CM | POA: Diagnosis not present

## 2019-05-06 DIAGNOSIS — L821 Other seborrheic keratosis: Secondary | ICD-10-CM | POA: Diagnosis not present

## 2019-05-06 DIAGNOSIS — L819 Disorder of pigmentation, unspecified: Secondary | ICD-10-CM | POA: Diagnosis not present

## 2019-05-06 DIAGNOSIS — L84 Corns and callosities: Secondary | ICD-10-CM | POA: Diagnosis not present

## 2019-05-06 DIAGNOSIS — L918 Other hypertrophic disorders of the skin: Secondary | ICD-10-CM | POA: Diagnosis not present

## 2019-07-29 DIAGNOSIS — R399 Unspecified symptoms and signs involving the genitourinary system: Secondary | ICD-10-CM | POA: Diagnosis not present

## 2019-07-29 DIAGNOSIS — N941 Unspecified dyspareunia: Secondary | ICD-10-CM | POA: Diagnosis not present

## 2019-08-25 DIAGNOSIS — R519 Headache, unspecified: Secondary | ICD-10-CM | POA: Diagnosis not present

## 2019-08-25 DIAGNOSIS — J029 Acute pharyngitis, unspecified: Secondary | ICD-10-CM | POA: Diagnosis not present

## 2019-08-25 DIAGNOSIS — Z20828 Contact with and (suspected) exposure to other viral communicable diseases: Secondary | ICD-10-CM | POA: Diagnosis not present

## 2019-08-25 DIAGNOSIS — R05 Cough: Secondary | ICD-10-CM | POA: Diagnosis not present

## 2019-08-29 DIAGNOSIS — Z20828 Contact with and (suspected) exposure to other viral communicable diseases: Secondary | ICD-10-CM | POA: Diagnosis not present

## 2019-09-08 ENCOUNTER — Other Ambulatory Visit: Payer: Self-pay | Admitting: Obstetrics and Gynecology

## 2019-09-08 DIAGNOSIS — Z1231 Encounter for screening mammogram for malignant neoplasm of breast: Secondary | ICD-10-CM

## 2019-09-18 DIAGNOSIS — H18513 Endothelial corneal dystrophy, bilateral: Secondary | ICD-10-CM | POA: Diagnosis not present

## 2019-09-18 DIAGNOSIS — H40023 Open angle with borderline findings, high risk, bilateral: Secondary | ICD-10-CM | POA: Diagnosis not present

## 2019-09-18 DIAGNOSIS — D3131 Benign neoplasm of right choroid: Secondary | ICD-10-CM | POA: Diagnosis not present

## 2019-09-18 DIAGNOSIS — H2513 Age-related nuclear cataract, bilateral: Secondary | ICD-10-CM | POA: Diagnosis not present

## 2019-09-23 ENCOUNTER — Other Ambulatory Visit: Payer: Self-pay | Admitting: Obstetrics and Gynecology

## 2019-09-23 DIAGNOSIS — N644 Mastodynia: Secondary | ICD-10-CM

## 2019-10-06 ENCOUNTER — Other Ambulatory Visit: Payer: Self-pay

## 2019-10-06 ENCOUNTER — Ambulatory Visit
Admission: RE | Admit: 2019-10-06 | Discharge: 2019-10-06 | Disposition: A | Discharge status: Home / Self Care | Payer: BC Managed Care – PPO | Source: Ambulatory Visit | Attending: Obstetrics and Gynecology | Admitting: Obstetrics and Gynecology

## 2019-10-06 ENCOUNTER — Ambulatory Visit: Payer: BC Managed Care – PPO

## 2019-10-06 DIAGNOSIS — R922 Inconclusive mammogram: Secondary | ICD-10-CM | POA: Diagnosis not present

## 2019-10-06 DIAGNOSIS — N644 Mastodynia: Secondary | ICD-10-CM

## 2019-10-17 ENCOUNTER — Other Ambulatory Visit: Payer: Self-pay | Admitting: Obstetrics and Gynecology

## 2019-10-17 DIAGNOSIS — Z9189 Other specified personal risk factors, not elsewhere classified: Secondary | ICD-10-CM | POA: Diagnosis not present

## 2019-10-17 DIAGNOSIS — Z01419 Encounter for gynecological examination (general) (routine) without abnormal findings: Secondary | ICD-10-CM | POA: Diagnosis not present

## 2019-10-17 DIAGNOSIS — Z6825 Body mass index (BMI) 25.0-25.9, adult: Secondary | ICD-10-CM | POA: Diagnosis not present

## 2020-05-07 DIAGNOSIS — E7800 Pure hypercholesterolemia, unspecified: Secondary | ICD-10-CM | POA: Diagnosis not present

## 2020-05-07 DIAGNOSIS — Z0000 Encounter for general adult medical examination without abnormal findings: Secondary | ICD-10-CM | POA: Diagnosis not present

## 2020-05-07 DIAGNOSIS — Z131 Encounter for screening for diabetes mellitus: Secondary | ICD-10-CM | POA: Diagnosis not present

## 2020-05-14 ENCOUNTER — Other Ambulatory Visit: Payer: BC Managed Care – PPO

## 2020-05-20 ENCOUNTER — Other Ambulatory Visit: Payer: Self-pay | Admitting: Obstetrics and Gynecology

## 2020-05-20 DIAGNOSIS — Z803 Family history of malignant neoplasm of breast: Secondary | ICD-10-CM

## 2020-05-23 ENCOUNTER — Other Ambulatory Visit: Payer: Self-pay

## 2020-05-23 ENCOUNTER — Encounter (HOSPITAL_COMMUNITY): Payer: Self-pay | Admitting: Emergency Medicine

## 2020-05-23 ENCOUNTER — Ambulatory Visit (HOSPITAL_COMMUNITY)
Admission: EM | Admit: 2020-05-23 | Discharge: 2020-05-23 | Disposition: A | Discharge status: Home / Self Care | Payer: BC Managed Care – PPO

## 2020-05-23 DIAGNOSIS — S61214A Laceration without foreign body of right ring finger without damage to nail, initial encounter: Secondary | ICD-10-CM

## 2020-05-23 MED ORDER — LIDOCAINE HCL (PF) 2 % IJ SOLN
INTRAMUSCULAR | Status: AC
  Filled 2020-05-23: qty 5

## 2020-05-23 NOTE — Discharge Instructions (Signed)

## 2020-05-23 NOTE — ED Provider Notes (Signed)
[facility]    CSN: [id number] Arrival date & time: 05/23/20  1751      History   Chief Complaint Chief Complaint  Patient presents with  . Finger Injury    HPI Katelyn Cooke is a 54 y.o. female.   Patient reports for cut to right ring finger.  She reports she this on glass in the kitchen.  She reports the glass slipped and puncturing cutting the right ring finger.  She reports bleeding was controlled with bandage.  She washed this out with soap and water.  Friends of hers who are physicians recommended she come get evaluated.     Past Medical History:  Diagnosis Date  . Essential tremor   . Hyperlipemia     Patient Active Problem List   Diagnosis Date Noted  . Palpitations 12/04/2014    Past Surgical History:  Procedure Laterality Date  . EYE SURGERY      OB History   No obstetric history on file.      Home Medications    Prior to Admission medications   Medication Sig Start Date End Date Taking? Authorizing Provider  clonazePAM (KLONOPIN) 0.5 MG tablet Take 0.5 mg by mouth.   Yes [provider]  zolpidem (AMBIEN) 10 MG tablet Take 10 mg by mouth.   Yes [provider]  buPROPion (WELLBUTRIN XL) 300 MG 24 hr tablet Take 300 mg by mouth.    [provider]  norgestrel-ethinyl estradiol (LO/OVRAL,CRYSELLE) 0.3-30 MG-MCG tablet Take 1 tablet by mouth.    [provider]    Family History Family History  Problem Relation Age of Onset  . CAD Mother 82       Stent  . Stroke Father 27    Social History Social History   Tobacco Use  . Smoking status: Former Research scientist (life sciences)  . Smokeless tobacco: Never Used  . Tobacco comment: quit smoking 5 years ago  Substance Use Topics  . Alcohol use: Yes    Alcohol/week: 10.0 standard drinks    Types: 10 Standard drinks or equivalent per week  . Drug use: Never     Allergies   Codeine, Penicillins, and Zithromax [azithromycin]   Review of Systems Review of  Systems   Physical Exam Triage Vital Signs ED Triage Vitals  Enc Vitals Group     BP 05/23/20 1835 (!) 140/95     Pulse Rate 05/23/20 1835 69     Resp 05/23/20 1835 16     Temp 05/23/20 1835 98.1 F (36.7 C)     Temp Source 05/23/20 1835 Oral     SpO2 05/23/20 1835 100 %     Weight --      Height --      Head Circumference --      Peak Flow --      Pain Score 05/23/20 1836 4     Pain Loc --      Pain Edu? --      Excl. in [place]? --    No data found.  Updated Vital Signs BP (!) 140/95 (BP Location: Right Arm)   Pulse 69   Temp 98.1 F (36.7 C) (Oral)   Resp 16   SpO2 100%   Visual Acuity Right Eye Distance:   Left Eye Distance:   Bilateral Distance:    Right Eye Near:   Left Eye Near:    Bilateral Near:     Physical Exam Vitals and nursing note reviewed.  Musculoskeletal:  Hands:     Comments: Right ring finger with approximately 1 cm laceration the over the skin of the volar proximal phalanx.  As marked.  Patient has good flexion and extension with resistance.  Cap refill less than 2 seconds.  Sensation intact.      UC Treatments / Results  Labs (all labs ordered are listed, but only abnormal results are displayed) Labs Reviewed - No data to display  EKG   Radiology No results found.  Procedures Procedures (including critical care time) Laceration repair performed by colleague Jaynee Eagles, PA-C Medications Ordered in UC Medications - No data to display  Initial Impression / Assessment and Plan / UC Course  I have reviewed the triage vital signs and the nursing notes.  Pertinent labs & imaging results that were available during my care of the patient were reviewed by me and considered in my medical decision making (see chart for details).     #Laceration right ring finger Patient is a 54 year old presenting with laceration right ring finger.  No sign of tendon involvement.  Neurovascularly intact.  Laceration repair performed by  colleague, see laceration repair note.  Following offered to help in order to continue patient follow in the clinic.  Wound care instructions given.  Charge patient to have sutures removed about 10 days.  Signs of infection discussed.  Patient verbalized understanding plan of care. Final Clinical Impressions(s) / UC Diagnoses   Final diagnoses:  Laceration of right ring finger without foreign body without damage to nail, initial encounter     Discharge Instructions     WOUND CARE Please return in 10 days to have your stitches/staples removed or sooner if you have concerns. Marland Kitchen Keep area clean and dry for 24 hours. Do not remove bandage, if applied. . After 24 hours, remove bandage and wash wound gently with mild soap and warm water. Reapply a new bandage after cleaning wound, if directed. . Continue daily cleansing with soap and water until stitches/staples are removed. . Do not apply any ointments or creams to the wound while stitches/staples are in place, as this may cause delayed healing. . Notify the office if you experience any of the following signs of infection: Swelling, redness, pus drainage, streaking, fever >101.0 F . Notify the office if you experience excessive bleeding that does not stop after 15-20 minutes of constant, firm pressure.    ED Prescriptions    None     PDMP not reviewed this encounter.   Purnell Shoemaker, PA-C 05/23/20 1936

## 2020-05-23 NOTE — ED Triage Notes (Signed)
Pt was picking up a glass and it slipped and cut her right ring finger.

## 2020-05-23 NOTE — ED Provider Notes (Signed)
PROCEDURE NOTE: laceration repair Verbal consent obtained from patient.  Local anesthesia with 2cc Lidocaine 2% without epinephrine.  Wound explored for tendon, ligament damage. Wound scrubbed with soap and water and rinsed. Wound closed with #4 5-0 Prolene (3 simple interrupted, 1 horizontal mattress) sutures.  Wound cleansed and dressed.   Jaynee Eagles, PA-C 05/23/20 1920

## 2020-05-29 ENCOUNTER — Ambulatory Visit
Admission: RE | Admit: 2020-05-29 | Discharge: 2020-05-29 | Disposition: A | Discharge status: Home / Self Care | Payer: PRIVATE HEALTH INSURANCE | Source: Ambulatory Visit | Attending: Obstetrics and Gynecology | Admitting: Obstetrics and Gynecology

## 2020-05-29 ENCOUNTER — Other Ambulatory Visit: Payer: Self-pay

## 2020-05-29 DIAGNOSIS — Z803 Family history of malignant neoplasm of breast: Secondary | ICD-10-CM

## 2020-05-29 MED ORDER — GADOBUTROL 1 MMOL/ML IV SOLN
6.0000 mL | Freq: Once | INTRAVENOUS | Status: AC | PRN
  Administered 2020-05-29: 6 mL via INTRAVENOUS

## 2020-05-30 ENCOUNTER — Other Ambulatory Visit: Payer: BC Managed Care – PPO

## 2020-06-04 DIAGNOSIS — L819 Disorder of pigmentation, unspecified: Secondary | ICD-10-CM | POA: Diagnosis not present

## 2020-06-04 DIAGNOSIS — L720 Epidermal cyst: Secondary | ICD-10-CM | POA: Diagnosis not present

## 2020-06-04 DIAGNOSIS — D225 Melanocytic nevi of trunk: Secondary | ICD-10-CM | POA: Diagnosis not present

## 2020-06-04 DIAGNOSIS — L821 Other seborrheic keratosis: Secondary | ICD-10-CM | POA: Diagnosis not present

## 2020-06-16 ENCOUNTER — Other Ambulatory Visit: Payer: Self-pay | Admitting: *Deleted

## 2020-06-16 ENCOUNTER — Other Ambulatory Visit: Payer: Self-pay | Admitting: Otolaryngology

## 2020-06-16 ENCOUNTER — Ambulatory Visit
Admission: RE | Admit: 2020-06-16 | Discharge: 2020-06-16 | Disposition: A | Discharge status: Home / Self Care | Payer: BC Managed Care – PPO | Source: Ambulatory Visit | Attending: Otolaryngology | Admitting: Otolaryngology

## 2020-06-16 DIAGNOSIS — S1985XA Other specified injuries of pharynx and cervical esophagus, initial encounter: Secondary | ICD-10-CM

## 2020-06-16 DIAGNOSIS — I720 Aneurysm of carotid artery: Secondary | ICD-10-CM

## 2020-06-16 DIAGNOSIS — S170XXA Crushing injury of larynx and trachea, initial encounter: Secondary | ICD-10-CM | POA: Diagnosis not present

## 2020-06-16 DIAGNOSIS — R002 Palpitations: Secondary | ICD-10-CM

## 2020-06-16 MED ORDER — IOPAMIDOL (ISOVUE-370) INJECTION 76%
75.0000 mL | Freq: Once | INTRAVENOUS | Status: AC | PRN
  Administered 2020-06-16: 75 mL via INTRAVENOUS

## 2020-06-18 ENCOUNTER — Ambulatory Visit: Payer: BC Managed Care – PPO | Admitting: Vascular Surgery

## 2020-06-18 ENCOUNTER — Ambulatory Visit (HOSPITAL_COMMUNITY)
Admission: RE | Admit: 2020-06-18 | Discharge: 2020-06-18 | Disposition: A | Discharge status: Home / Self Care | Payer: BC Managed Care – PPO | Source: Ambulatory Visit | Attending: Vascular Surgery | Admitting: Vascular Surgery

## 2020-06-18 ENCOUNTER — Other Ambulatory Visit: Payer: Self-pay

## 2020-06-18 ENCOUNTER — Encounter: Payer: Self-pay | Admitting: Vascular Surgery

## 2020-06-18 VITALS — BP 119/81 | HR 76 | Temp 98.1°F | Resp 20 | Ht 61.0 in | Wt 132.0 lb

## 2020-06-18 DIAGNOSIS — I720 Aneurysm of carotid artery: Secondary | ICD-10-CM

## 2020-06-18 NOTE — Progress Notes (Signed)
Patient ID: Katelyn Cooke, female   DOB: 05-06-1966, 54 y.o.   MRN: [id number]  Reason for Consult: New Patient (Initial Visit)   Referred by Marylynn Pearson, MD  Subjective:     HPI:  Katelyn Cooke is a 54 y.o. female without significant without significant vascular history recently had a tooth brush self inflicted injury on the left.  She subsequently underwent CT scanning which demonstrated concern for carotid dissection with pseudoaneurysm.  She does not have any previous history of any stroke, TIA or amaurosis.  She is a non-smoker.  She does have a family history of a father with multiple strokes no previous vascular history herself.  She does have a known history of hyperlipidemia.  She has begun taking aspirin since the diagnosis of carotid dissection.  Past Medical History:  Diagnosis Date  . Carotid artery occlusion   . Essential tremor   . Hyperlipemia    Family History  Problem Relation Age of Onset  . CAD Mother 28       Stent  . Stroke Father 24   Past Surgical History:  Procedure Laterality Date  . EYE SURGERY      Short Social History:  Social History   Tobacco Use  . Smoking status: Former Research scientist (life sciences)  . Smokeless tobacco: Never Used  . Tobacco comment: quit smoking 5 years ago  Substance Use Topics  . Alcohol use: Yes    Alcohol/week: 10.0 standard drinks    Types: 10 Standard drinks or equivalent per week    Allergies  Allergen Reactions  . Sulfa Antibiotics Other (See Comments)  . Codeine Nausea Only  . Penicillins Rash  . Zithromax [Azithromycin] Rash    Current Outpatient Medications  Medication Sig Dispense Refill  . clonazePAM (KLONOPIN) 0.5 MG tablet Take 0.5 mg by mouth.    . EPINEPHrine 0.3 mg/0.3 mL IJ SOAJ injection See admin instructions.    Marland Kitchen zolpidem (AMBIEN) 10 MG tablet Take 10 mg by mouth.    Marland Kitchen buPROPion (WELLBUTRIN XL) 300 MG 24 hr tablet Take 300 mg by mouth. (Patient not taking: Reported on 06/18/2020)    .  norgestrel-ethinyl estradiol (LO/OVRAL,CRYSELLE) 0.3-30 MG-MCG tablet Take 1 tablet by mouth. (Patient not taking: Reported on 06/18/2020)     No current facility-administered medications for this visit.    Review of Systems  Constitutional:  Constitutional negative. HENT: HENT negative.  Eyes: Eyes negative.  Respiratory: Respiratory negative.  Cardiovascular: Cardiovascular negative.  GI: Gastrointestinal negative.  Musculoskeletal: Musculoskeletal negative.  Skin: Skin negative.  Neurological: Neurological negative. Hematologic: Hematologic/lymphatic negative.  Psychiatric: Psychiatric negative.        Objective:  Objective   Vitals:   06/18/20 1428 06/18/20 1430  BP: 133/82 119/81  Pulse: 76   Resp: 20   Temp: 98.1 F (36.7 C)   SpO2: 97%   Weight: 132 lb (59.9 kg)   Height: 5\' 1"  (1.549 m)    Body mass index is 24.94 kg/m.  Physical Exam HENT:     Head: Normocephalic.     Nose:     Comments: Wearing a mask Eyes:     Pupils: Pupils are equal, round, and reactive to light.  Cardiovascular:     Rate and Rhythm: Normal rate.     Pulses: Normal pulses.  Pulmonary:     Effort: Pulmonary effort is normal.  Abdominal:     General: Abdomen is flat.     Palpations: Abdomen is soft.  Musculoskeletal:  Cervical back: Normal range of motion and neck supple.  Skin:    General: Skin is warm.     Capillary Refill: Capillary refill takes less than 2 seconds.  Neurological:     General: No focal deficit present.     Mental Status: She is alert.  Psychiatric:        Mood and Affect: Mood normal.        Behavior: Behavior normal.        Thought Content: Thought content normal.        Judgment: Judgment normal.     Data: CTA IMPRESSION: Findings consistent with traumatic dissection involving the left internal carotid artery at the skull base with likely small pseudoaneurysm. No greater than 50% stenosis. Surrounding soft tissue contusion centered at the left  tongue base. Recommend neurosurgery or vascular surgery consultation.      Left Carotid Findings:  +----------+--------+--------+--------+------------------+--------+       PSV cm/sEDV cm/sStenosisPlaque DescriptionComments  +----------+--------+--------+--------+------------------+--------+  CCA Prox 114   29                      +----------+--------+--------+--------+------------------+--------+  CCA Distal99   30                      +----------+--------+--------+--------+------------------+--------+  ICA Prox 60   19   Normal                +----------+--------+--------+--------+------------------+--------+  ICA Mid  80   34                      +----------+--------+--------+--------+------------------+--------+  ICA Distal112   44                      +----------+--------+--------+--------+------------------+--------+  ECA    76   21                      +----------+--------+--------+--------+------------------+--------+           Carotid duplex summary:    Left Carotid: The extracranial vessels were near-normal with only minimal  wall        thickening or plaque. No evidence of pseudoaneurysm in  visualized        segments of the internal carotid artery.     Assessment/Plan:    53 year old with recent history of concern for carotid artery dissection.  She has been asymptomatic and has begun taking aspirin for this.  In review of the scan I cannot really appreciate a dissection I do see a focal dilatation of the carotid artery near the skull base and although there is some soft tissue swelling this is most likely related to her toothbrush injury which was much lower than the skull base.  Putting this altogether does seem to me to be an incidental finding however  this cannot be completely ruled out.  I have asked her to continue aspirin and we will repeat a CT scan in 4 to 6 weeks to make sure that all is stable.  Unfortunately the area of concern does appear to high to evaluate with duplex.  If it appears unchanged in 4 to 6 weeks we can make a determination on the need for aspirin and the interval with which to repeat scanning.     Waynetta Sandy MD [facility]

## 2020-06-21 ENCOUNTER — Other Ambulatory Visit: Payer: Self-pay

## 2020-06-21 DIAGNOSIS — I720 Aneurysm of carotid artery: Secondary | ICD-10-CM

## 2020-07-29 ENCOUNTER — Ambulatory Visit
Admission: RE | Admit: 2020-07-29 | Discharge: 2020-07-29 | Disposition: A | Discharge status: Home / Self Care | Payer: BC Managed Care – PPO | Source: Ambulatory Visit | Attending: Vascular Surgery | Admitting: Vascular Surgery

## 2020-07-29 ENCOUNTER — Other Ambulatory Visit: Payer: Self-pay

## 2020-07-29 DIAGNOSIS — I720 Aneurysm of carotid artery: Secondary | ICD-10-CM | POA: Diagnosis not present

## 2020-07-29 DIAGNOSIS — I7771 Dissection of carotid artery: Secondary | ICD-10-CM | POA: Diagnosis not present

## 2020-07-29 DIAGNOSIS — R221 Localized swelling, mass and lump, neck: Secondary | ICD-10-CM | POA: Diagnosis not present

## 2020-07-29 MED ORDER — IOPAMIDOL (ISOVUE-370) INJECTION 76%
75.0000 mL | Freq: Once | INTRAVENOUS | Status: AC | PRN
  Administered 2020-07-29: 75 mL via INTRAVENOUS

## 2020-07-30 ENCOUNTER — Ambulatory Visit (INDEPENDENT_AMBULATORY_CARE_PROVIDER_SITE_OTHER): Payer: BC Managed Care – PPO | Admitting: Vascular Surgery

## 2020-07-30 ENCOUNTER — Ambulatory Visit: Payer: BC Managed Care – PPO | Admitting: Vascular Surgery

## 2020-07-30 ENCOUNTER — Encounter: Payer: Self-pay | Admitting: Vascular Surgery

## 2020-07-30 VITALS — BP 127/86 | HR 80 | Temp 98.0°F | Resp 20 | Wt 134.0 lb

## 2020-07-30 DIAGNOSIS — I720 Aneurysm of carotid artery: Secondary | ICD-10-CM

## 2020-07-30 NOTE — Progress Notes (Signed)
Patient ID: Katelyn Cooke, female   DOB: April 25, 1966, 54 y.o.   MRN: [id number]  Reason for Consult: Follow-up   Referred by Marylynn Pearson, MD  Subjective:     HPI:  Katelyn Cooke is a 54 y.o. female without significant vascular history had an accidental toothbrush injury and underwent CT scanning of her carotid which demonstrated concern for carotid dissection. She did not have a stroke, TIA or amaurosis. She is a non-smoker. Subsequently does not have any pain in the side of her injury. She is here today with repeat CT scan. She has no complaints related to today's visit.  Past Medical History:  Diagnosis Date  . Carotid artery occlusion   . Essential tremor   . Hyperlipemia    Family History  Problem Relation Age of Onset  . CAD Mother 62       Stent  . Stroke Father 83   Past Surgical History:  Procedure Laterality Date  . EYE SURGERY      Short Social History:  Social History   Tobacco Use  . Smoking status: Former Research scientist (life sciences)  . Smokeless tobacco: Never Used  . Tobacco comment: quit smoking 5 years ago  Substance Use Topics  . Alcohol use: Yes    Alcohol/week: 10.0 standard drinks    Types: 10 Standard drinks or equivalent per week    Allergies  Allergen Reactions  . Sulfa Antibiotics Other (See Comments)  . Codeine Nausea Only  . Penicillins Rash  . Zithromax [Azithromycin] Rash    Current Outpatient Medications  Medication Sig Dispense Refill  . clonazePAM (KLONOPIN) 0.5 MG tablet Take 0.5 mg by mouth.    . EPINEPHrine 0.3 mg/0.3 mL IJ SOAJ injection See admin instructions.    Marland Kitchen zolpidem (AMBIEN) 10 MG tablet Take 10 mg by mouth.     No current facility-administered medications for this visit.    Review of Systems  Constitutional:  Constitutional negative. HENT: HENT negative.  Eyes: Eyes negative.  Respiratory: Respiratory negative.  Cardiovascular: Cardiovascular negative.  GI: Gastrointestinal negative.  Musculoskeletal:  Musculoskeletal negative.  Skin: Skin negative.  Neurological: Neurological negative. Hematologic: Hematologic/lymphatic negative.  Psychiatric: Psychiatric negative.        Objective:  Objective   Vitals:   07/30/20 1024  BP: 127/86  Pulse: 80  Resp: 20  Temp: 98 F (36.7 C)  SpO2: 96%  Weight: 134 lb (60.8 kg)   Body mass index is 25.32 kg/m.  Physical Exam HENT:     Head: Normocephalic.  Eyes:     Pupils: Pupils are equal, round, and reactive to light.  Cardiovascular:     Pulses: Normal pulses.  Abdominal:     General: Abdomen is flat.     Palpations: Abdomen is soft.  Musculoskeletal:        General: No swelling. Normal range of motion.  Skin:    General: Skin is warm and dry.     Capillary Refill: Capillary refill takes less than 2 seconds.  Neurological:     Mental Status: She is alert.  Psychiatric:        Mood and Affect: Mood normal.        Behavior: Behavior normal.        Thought Content: Thought content normal.        Judgment: Judgment normal.     Data: CTA IMPRESSION: 1. Unchanged irregularity of the distal left cervical ICA with possible small pseudoaneurysm which may reflect previous traumatic injury. No discrete dissection  flap or significant stenosis. 2. Interval resolution of regional soft tissue swelling. 3. Otherwise negative head and neck CTA.     Assessment/Plan:     54 year old female with the above-noted accidental toothbrush injury concern for left carotid artery dissection. There is an irregularity of her distal left ICA however this is likely a chronic issue and then incidental finding from her recent CTA. Is not truly aneurysmal given its size although there is one tiny area of pseudoaneurysm. I discussed with her the options and we have elected to follow-up within 1 year with a CT scan. Should she have any stroke or TIA symptoms I would need to see her before this I think this is very low likelihood. I have asked her to take  baby aspirin daily we will see her in 1 year.     Waynetta Sandy MD [facility]

## 2020-09-23 DIAGNOSIS — D3131 Benign neoplasm of right choroid: Secondary | ICD-10-CM | POA: Diagnosis not present

## 2020-09-23 DIAGNOSIS — H5213 Myopia, bilateral: Secondary | ICD-10-CM | POA: Diagnosis not present

## 2020-09-23 DIAGNOSIS — H25013 Cortical age-related cataract, bilateral: Secondary | ICD-10-CM | POA: Diagnosis not present

## 2020-09-23 DIAGNOSIS — H2513 Age-related nuclear cataract, bilateral: Secondary | ICD-10-CM | POA: Diagnosis not present

## 2020-09-23 DIAGNOSIS — H40023 Open angle with borderline findings, high risk, bilateral: Secondary | ICD-10-CM | POA: Diagnosis not present

## 2020-09-28 ENCOUNTER — Other Ambulatory Visit: Payer: Self-pay | Admitting: Obstetrics and Gynecology

## 2020-09-28 DIAGNOSIS — Z1231 Encounter for screening mammogram for malignant neoplasm of breast: Secondary | ICD-10-CM

## 2020-10-22 DIAGNOSIS — F4323 Adjustment disorder with mixed anxiety and depressed mood: Secondary | ICD-10-CM | POA: Diagnosis not present

## 2020-10-29 DIAGNOSIS — F4323 Adjustment disorder with mixed anxiety and depressed mood: Secondary | ICD-10-CM | POA: Diagnosis not present

## 2020-11-05 ENCOUNTER — Ambulatory Visit
Admission: RE | Admit: 2020-11-05 | Discharge: 2020-11-05 | Disposition: A | Discharge status: Home / Self Care | Payer: BC Managed Care – PPO | Source: Ambulatory Visit | Attending: Obstetrics and Gynecology | Admitting: Obstetrics and Gynecology

## 2020-11-05 ENCOUNTER — Other Ambulatory Visit: Payer: Self-pay

## 2020-11-05 DIAGNOSIS — Z1231 Encounter for screening mammogram for malignant neoplasm of breast: Secondary | ICD-10-CM | POA: Diagnosis not present

## 2020-11-12 DIAGNOSIS — F4323 Adjustment disorder with mixed anxiety and depressed mood: Secondary | ICD-10-CM | POA: Diagnosis not present

## 2020-12-11 DIAGNOSIS — F4323 Adjustment disorder with mixed anxiety and depressed mood: Secondary | ICD-10-CM | POA: Diagnosis not present

## 2020-12-13 ENCOUNTER — Telehealth: Payer: Self-pay | Admitting: Cardiology

## 2020-12-13 NOTE — Telephone Encounter (Addendum)
Spoke with patient and she has been having intermittent chest pains for 8-10 days  Pain in center of chest. At times radiates to left side of chest, arm, and back. 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  Stated when she pushes her chest area eases up but feels like pain worse in back/arm  No blood pressure taken  Patient stated she just doesn't feel right.  Has appointment Friday 3/11 Call back number [phone] Will forward to Dr Percival Spanish for review

## 2020-12-13 NOTE — Telephone Encounter (Signed)
Pt c/o of Chest Pain: STAT if CP now or developed within 24 hours  1. Are you having CP right now? Yes, pain in left of breast bone goes through back, arm, & jaw before going away   2. Are you experiencing any other symptoms (ex. SOB, nausea, vomiting, sweating)? Sweating on and off because of age & dizziness  3. How long have you been experiencing CP? A week and 8-10 days   4. Is your CP continuous or coming and going? Continuous   5. Have you taken Nitroglycerin? No  Wanting a sooner appt with Dr. Percival Spanish. 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  ?

## 2020-12-14 ENCOUNTER — Telehealth: Payer: Self-pay | Admitting: Cardiology

## 2020-12-14 NOTE — Telephone Encounter (Signed)
Advised patient, verbalized understanding  Order placed and message sent to scheduling to arrange

## 2020-12-14 NOTE — Telephone Encounter (Signed)
Please call her to arrange a calcium score before her appt.

## 2020-12-14 NOTE — Telephone Encounter (Signed)
Spoke with patient regarding Thursday 12/16/20 4:30 pm calcium scoring appointment at [address]---- arrival time is 4:15 pm for check in.  Patient is aware of the $99.00 out of pocket cost.  Patient given phone # [phone] to call for cancellations prior to Thursday's visit.

## 2020-12-15 DIAGNOSIS — I10 Essential (primary) hypertension: Secondary | ICD-10-CM | POA: Insufficient documentation

## 2020-12-15 DIAGNOSIS — R072 Precordial pain: Secondary | ICD-10-CM | POA: Insufficient documentation

## 2020-12-15 NOTE — Progress Notes (Signed)
Cardiology Office Note   Date:  12/17/2020   ID:  Katelyn, Cooke [birth year], MRN [id number]  PCP:  Kelton Pillar, MD Cardiologist:   No primary care provider on file. Referring:  Self  Chief Complaint  Patient presents with  . Chest Pain      History of Present Illness: Katelyn Cooke is a 55 y.o. female who is self referred for evaluation of chest pain.  I sent her for a coronary calcium score which she had yesterday ad her score was 47.9 but this was at the 91st percentile for her age and gender.    She recently had trauma to her neck .  She does have a CT angiogram of the neck she had a possible left carotid pseudoaneurysm.   I saw her in 2016 for palpitations and arm pain.   She had no significant arrhythmias on a monitor that I ordered.    She says that she has been getting this for a couple of weeks.  Chest discomfort is intermittent discomfort.  It is sharp.  It lasts for about a minute.  He has been up to 7 out of 10 in intensity.  It happens sporadically.  It goes away spontaneously.  Its not like her previous reflux.  He does have a little jaw discomfort.  There is some radiation she thinks slightly with maybe some arm tingling.  However, she has been able to exercise like play tennis without bringing this on necessarily.  It might happen during exercise but it is not reproducible.  She has had some sensation like her heart has been racing.  Her normal heart rates in the 80s to 90s but it has been up to the 120s.  She has had what she thinks is more reflux and has been taking Pepcid but again this discomfort is not like that.  She has had some mild headache or positional symptoms that she thinks feels like her vertigo is trying to happen.  She describes what called a "hot flash in the head."  She did have mild to moderate COVID in 2020.  Past Medical History:  Diagnosis Date  . Carotid artery occlusion   . Essential tremor   . Hyperlipemia     Past Surgical  History:  Procedure Laterality Date  . EYE SURGERY       Current Outpatient Medications  Medication Sig Dispense Refill  . clonazePAM (KLONOPIN) 0.5 MG tablet Take 0.5 mg by mouth.    . EPINEPHrine 0.3 mg/0.3 mL IJ SOAJ injection See admin instructions.    Marland Kitchen zolpidem (AMBIEN) 10 MG tablet Take 10 mg by mouth.     No current facility-administered medications for this visit.    Allergies:   Sulfa antibiotics, Codeine, Penicillins, and Zithromax [azithromycin]    Social History:  The patient  reports that she has quit smoking. She has never used smokeless tobacco. She reports current alcohol use of about 10.0 standard drinks of alcohol per week. She reports that she does not use drugs.   Family History:  The patient's family history includes CAD (age of onset: 4) in her mother; Stroke (age of onset: 12) in her father.    ROS:  Please see the history of present illness.   Otherwise, review of systems are positive for decreased sleep, none.   All other systems are reviewed and negative.    PHYSICAL EXAM: VS:  BP 130/82   Pulse 75   Ht 5' 1.5" (1.562 m)  Wt 131 lb 12.8 oz (59.8 kg)   SpO2 98%   BMI 24.50 kg/m  , BMI Body mass index is 24.5 kg/m. GENERAL:  Well appearing HEENT:  Pupils equal round and reactive, fundi not visualized, oral mucosa unremarkable NECK:  No jugular venous distention, waveform within normal limits, carotid upstroke brisk and symmetric, no bruits, no thyromegaly LYMPHATICS:  No cervical, inguinal adenopathy LUNGS:  Clear to auscultation bilaterally BACK:  No CVA tenderness CHEST:  Unremarkable HEART:  PMI not displaced or sustained,S1 and S2 within normal limits, no S3, no S4, no clicks, no rubs, no murmurs ABD:  Flat, positive bowel sounds normal in frequency in pitch, no bruits, no rebound, no guarding, no midline pulsatile mass, no hepatomegaly, no splenomegaly EXT:  2 plus pulses throughout, no edema, no cyanosis no clubbing SKIN:  No rashes no  nodules NEURO:  Cranial nerves II through XII grossly intact, motor grossly intact throughout PSYCH:  Cognitively intact, oriented to person place and time    EKG:  EKG is ordered today. The ekg ordered today demonstrates sinus rhythm, rate 75, axis within normal limits, intervals within normal limits, poor anterior R wave progression, no acute ST-T wave changes.   Recent Labs: No results found for requested labs within last 8760 hours.    Lipid Panel No results found for: CHOL, TRIG, HDL, CHOLHDL, VLDL, LDLCALC, LDLDIRECT    Wt Readings from Last 3 Encounters:  12/17/20 131 lb 12.8 oz (59.8 kg)  07/30/20 134 lb (60.8 kg)  06/18/20 132 lb (59.9 kg)      Other studies Reviewed: Additional studies/ records that were reviewed today include: Coronary calcium score. Review of the above records demonstrates:  Please see elsewhere in the note.     ASSESSMENT AND PLAN:  CHEST PAIN: She has new onset chest pain which could be unstable angina.  He has elevated coronary calcium and family history.  Given this the pretest probability of obstructive coronary disease is moderately high.  Cardiac coronary CTA is indicated.  HTN: Her blood pressure is controlled.  No change in therapy.  DYSLIPIDEMIA: Her LDL was in the 190s previously.  This was a couple of years ago.  She is going to get a repeat lipid profile.  Based on this I will choose the dose of likely Crestor that I will start.  We talked about diet.   Current medicines are reviewed at length with the patient today.  The patient does not have concerns regarding medicines.  The following changes have been made:  no change  Labs/ tests ordered today include:   Orders Placed This Encounter  Procedures  . CT CORONARY MORPH W/CTA COR W/SCORE W/CA W/CM &/OR WO/CM  . CT CORONARY FRACTIONAL FLOW RESERVE DATA PREP  . CT CORONARY FRACTIONAL FLOW RESERVE FLUID ANALYSIS  . Lipid Profile  . Basic metabolic panel  . EKG 12-Lead      Disposition:   FU with me after the studies.   Signed, Minus Breeding, MD  12/17/2020 10:58 AM    [facility]

## 2020-12-16 ENCOUNTER — Ambulatory Visit
Admission: RE | Admit: 2020-12-16 | Discharge: 2020-12-16 | Disposition: A | Discharge status: Home / Self Care | Payer: Self-pay | Source: Ambulatory Visit | Attending: Cardiology | Admitting: Cardiology

## 2020-12-16 ENCOUNTER — Other Ambulatory Visit: Payer: Self-pay

## 2020-12-16 DIAGNOSIS — R079 Chest pain, unspecified: Secondary | ICD-10-CM

## 2020-12-17 ENCOUNTER — Encounter: Payer: Self-pay | Admitting: Cardiology

## 2020-12-17 ENCOUNTER — Ambulatory Visit: Payer: BC Managed Care – PPO | Admitting: Cardiology

## 2020-12-17 VITALS — BP 130/82 | HR 75 | Ht 61.5 in | Wt 131.8 lb

## 2020-12-17 DIAGNOSIS — R072 Precordial pain: Secondary | ICD-10-CM | POA: Diagnosis not present

## 2020-12-17 DIAGNOSIS — I10 Essential (primary) hypertension: Secondary | ICD-10-CM | POA: Diagnosis not present

## 2020-12-17 DIAGNOSIS — Z79899 Other long term (current) drug therapy: Secondary | ICD-10-CM

## 2020-12-17 NOTE — Patient Instructions (Addendum)
Medication Instructions:  The current medical regimen is effective;  continue present plan and medications as directed. Please refer to the Current Medication list given to you today.  *If you need a refill on your cardiac medications before your next appointment, please call your pharmacy*  Lab Work: LIPID AND CMET If you have labs (blood work) drawn today and your tests are completely normal, you will receive your results only by:  [place] (if you have MyChart) OR A paper copy in the mail.  If you have any lab test that is abnormal or we need to change your treatment, we will call you to review the results. You may go to any [facility] that is convenient for you however, we do have a lab in our office that is able to assist you. You DO NOT need an appointment for our lab. The lab is open 8:00am and closes at 4:00pm. Lunch 12:45 - 1:45pm.  Testing/Procedures: CORONARY CT AT [place]-BEFORE MID-April  Follow-Up: Your next appointment:  WILL DISCUSS  In Person with Minus Breeding, MD   At [facility], you and your health needs are our priority.  As part of our continuing mission to provide you with exceptional heart care, we have created designated Provider Care Teams.  These Care Teams include your primary Cardiologist (physician) and Advanced Practice Providers (APPs -  Physician Assistants and Nurse Practitioners) who all work together to provide you with the care you need, when you need it.

## 2020-12-18 LAB — BASIC METABOLIC PANEL
BUN/Creatinine Ratio: 16 (ref 9–23)
BUN: 14 mg/dL (ref 6–24)
CO2: 23 mmol/L (ref 20–29)
Calcium: 9.9 mg/dL (ref 8.7–10.2)
Chloride: 103 mmol/L (ref 96–106)
Creatinine, Ser: 0.9 mg/dL (ref 0.57–1.00)
Glucose: 99 mg/dL (ref 65–99)
Potassium: 4.9 mmol/L (ref 3.5–5.2)
Sodium: 143 mmol/L (ref 134–144)
eGFR: 75 mL/min/{1.73_m2} (ref >59)

## 2020-12-18 LAB — LIPID PANEL
Chol/HDL Ratio: 4.3 ratio (ref 0.0–4.4)
Cholesterol, Total: 268 mg/dL — ABNORMAL HIGH (ref 100–199)
HDL: 62 mg/dL (ref >39)
LDL Chol Calc (NIH): 185 mg/dL — ABNORMAL HIGH (ref 0–99)
Triglycerides: 117 mg/dL (ref 0–149)
VLDL Cholesterol Cal: 21 mg/dL (ref 5–40)

## 2020-12-20 ENCOUNTER — Telehealth: Payer: Self-pay | Admitting: *Deleted

## 2020-12-20 DIAGNOSIS — H66002 Acute suppurative otitis media without spontaneous rupture of ear drum, left ear: Secondary | ICD-10-CM | POA: Diagnosis not present

## 2020-12-20 DIAGNOSIS — Z79899 Other long term (current) drug therapy: Secondary | ICD-10-CM

## 2020-12-20 DIAGNOSIS — Z13220 Encounter for screening for lipoid disorders: Secondary | ICD-10-CM

## 2020-12-20 NOTE — Telephone Encounter (Signed)
-----   Message from Minus Breeding, MD sent at 12/20/2020  9:58 AM EDT ----- She does not want to take statin but will take Red Yeast Rice and plant based diet.  Put in for fasting lipid to be redrawn in 3 months.  Waiting to hear about CT.  Send results to Kelton Pillar, MD

## 2020-12-20 NOTE — Telephone Encounter (Signed)
Dr Percival Spanish spoke with pt about her blood work, pt aware that CT with take up to 2 weeks before scheduling, fasting Lipids ordered and lab slip mailed to pt.

## 2020-12-23 ENCOUNTER — Telehealth: Payer: Self-pay | Admitting: *Deleted

## 2020-12-23 DIAGNOSIS — E7800 Pure hypercholesterolemia, unspecified: Secondary | ICD-10-CM

## 2020-12-23 DIAGNOSIS — Z79899 Other long term (current) drug therapy: Secondary | ICD-10-CM

## 2020-12-23 DIAGNOSIS — I10 Essential (primary) hypertension: Secondary | ICD-10-CM

## 2020-12-23 MED ORDER — ROSUVASTATIN CALCIUM 20 MG PO TABS: 20.0000 mg | ORAL_TABLET | Freq: Every day | ORAL | 3 refills | Status: DC

## 2020-12-23 NOTE — Telephone Encounter (Signed)
Advised patient of lab results, mailed lab orders and sent Rx to pharmacy

## 2020-12-23 NOTE — Telephone Encounter (Signed)
-----   Message from Minus Breeding, MD sent at 12/21/2020 11:30 AM EDT ----- She initially wanted to use red yeast rice but now wants to start Crestor.  I would suggest start with Crestor 20 mg po daily with a follow up lipid and liver profile in 8 weeks.  Thanks.

## 2020-12-24 DIAGNOSIS — Z01419 Encounter for gynecological examination (general) (routine) without abnormal findings: Secondary | ICD-10-CM | POA: Diagnosis not present

## 2020-12-24 DIAGNOSIS — Z6824 Body mass index (BMI) 24.0-24.9, adult: Secondary | ICD-10-CM | POA: Diagnosis not present

## 2020-12-29 DIAGNOSIS — H6502 Acute serous otitis media, left ear: Secondary | ICD-10-CM | POA: Diagnosis not present

## 2021-01-02 DIAGNOSIS — F4323 Adjustment disorder with mixed anxiety and depressed mood: Secondary | ICD-10-CM | POA: Diagnosis not present

## 2021-01-07 ENCOUNTER — Telehealth: Payer: Self-pay | Admitting: *Deleted

## 2021-01-07 ENCOUNTER — Telehealth (HOSPITAL_COMMUNITY): Payer: Self-pay | Admitting: Emergency Medicine

## 2021-01-07 MED ORDER — METOPROLOL TARTRATE 100 MG PO TABS: ORAL_TABLET | ORAL | 0 refills | Status: DC

## 2021-01-07 NOTE — Telephone Encounter (Signed)
-----   Message from Lorenza Evangelist, RN sent at 01/07/2021  9:18 AM EDT ----- Good morning! I was reviewing this chart for her upcoming CCTA on Monday. I dont see where anything was prescribed for HR control. If there was, what was it? Otherwise can I prescribe a 50mg  or 100mg  dose of metoprolol one time dose 2 hr prior to her scan Thank you, Marchia Bond

## 2021-01-07 NOTE — Telephone Encounter (Signed)
Reaching out to patient to offer assistance regarding upcoming cardiac imaging study; pt verbalizes understanding of appt date/time, parking situation and where to check in, pre-test NPO status and medications ordered, and verified current allergies; name and call back number provided for further questions should they arise Tykisha Areola RN Navigator [facility] [phone] office [phone] cell 

## 2021-01-07 NOTE — Telephone Encounter (Signed)
Metoprolol 100 mg one time dose sent to [facility] with patient and she is aware to take 2 hours prior to ct

## 2021-01-10 ENCOUNTER — Ambulatory Visit (HOSPITAL_COMMUNITY)
Admission: RE | Admit: 2021-01-10 | Discharge: 2021-01-10 | Disposition: A | Discharge status: Home / Self Care | Payer: BC Managed Care – PPO | Source: Ambulatory Visit | Attending: Cardiology | Admitting: Cardiology

## 2021-01-10 ENCOUNTER — Other Ambulatory Visit: Payer: Self-pay

## 2021-01-10 ENCOUNTER — Encounter (HOSPITAL_COMMUNITY): Payer: Self-pay

## 2021-01-10 DIAGNOSIS — R072 Precordial pain: Secondary | ICD-10-CM | POA: Insufficient documentation

## 2021-01-10 MED ORDER — NITROGLYCERIN 0.4 MG SL SUBL
SUBLINGUAL_TABLET | SUBLINGUAL | Status: AC
  Filled 2021-01-10: qty 2

## 2021-01-10 MED ORDER — IOHEXOL 350 MG/ML SOLN
80.0000 mL | Freq: Once | INTRAVENOUS | Status: AC | PRN
  Administered 2021-01-10: 80 mL via INTRAVENOUS

## 2021-01-10 MED ORDER — NITROGLYCERIN 0.4 MG SL SUBL
0.8000 mg | SUBLINGUAL_TABLET | Freq: Once | SUBLINGUAL | Status: AC
  Administered 2021-01-10: 0.8 mg via SUBLINGUAL

## 2021-01-31 ENCOUNTER — Ambulatory Visit: Payer: BC Managed Care – PPO | Admitting: Cardiology

## 2021-02-11 DIAGNOSIS — H6502 Acute serous otitis media, left ear: Secondary | ICD-10-CM | POA: Diagnosis not present

## 2021-02-18 DIAGNOSIS — K582 Mixed irritable bowel syndrome: Secondary | ICD-10-CM | POA: Diagnosis not present

## 2021-04-15 DIAGNOSIS — E7800 Pure hypercholesterolemia, unspecified: Secondary | ICD-10-CM | POA: Diagnosis not present

## 2021-04-15 DIAGNOSIS — Z79899 Other long term (current) drug therapy: Secondary | ICD-10-CM | POA: Diagnosis not present

## 2021-04-15 DIAGNOSIS — I10 Essential (primary) hypertension: Secondary | ICD-10-CM | POA: Diagnosis not present

## 2021-04-15 LAB — LIPID PANEL
Chol/HDL Ratio: 3.9 ratio (ref 0.0–4.4)
Cholesterol, Total: 200 mg/dL — ABNORMAL HIGH (ref 100–199)
HDL: 51 mg/dL (ref >39)
LDL Chol Calc (NIH): 126 mg/dL — ABNORMAL HIGH (ref 0–99)
Triglycerides: 131 mg/dL (ref 0–149)
VLDL Cholesterol Cal: 23 mg/dL (ref 5–40)

## 2021-04-15 LAB — HEPATIC FUNCTION PANEL
ALT: 44 IU/L — ABNORMAL HIGH (ref 0–32)
AST: 39 IU/L (ref 0–40)
Albumin: 5 g/dL — ABNORMAL HIGH (ref 3.8–4.9)
Alkaline Phosphatase: 99 IU/L (ref 44–121)
Bilirubin Total: <0.2 mg/dL (ref 0.0–1.2)
Bilirubin, Direct: <0.1 mg/dL (ref 0.00–0.40)
Total Protein: 7 g/dL (ref 6.0–8.5)

## 2021-04-18 ENCOUNTER — Telehealth: Payer: Self-pay | Admitting: *Deleted

## 2021-04-18 DIAGNOSIS — E785 Hyperlipidemia, unspecified: Secondary | ICD-10-CM

## 2021-04-18 MED ORDER — ROSUVASTATIN CALCIUM 40 MG PO TABS: 40.0000 mg | ORAL_TABLET | Freq: Every day | ORAL | 3 refills | Status: DC

## 2021-04-18 NOTE — Telephone Encounter (Signed)
-----   Message from Minus Breeding, MD sent at 04/17/2021  5:43 PM EDT ----- The LDL is much better.  From 185 down to 126.  I would suggest Crestor 40 mg daily and repeat lipid in 3 months.  Total went down from 268 to 200

## 2021-05-20 DIAGNOSIS — Z0000 Encounter for general adult medical examination without abnormal findings: Secondary | ICD-10-CM | POA: Diagnosis not present

## 2021-07-06 DIAGNOSIS — L738 Other specified follicular disorders: Secondary | ICD-10-CM | POA: Diagnosis not present

## 2021-07-06 DIAGNOSIS — L821 Other seborrheic keratosis: Secondary | ICD-10-CM | POA: Diagnosis not present

## 2021-07-06 DIAGNOSIS — B078 Other viral warts: Secondary | ICD-10-CM | POA: Diagnosis not present

## 2021-07-06 DIAGNOSIS — D1801 Hemangioma of skin and subcutaneous tissue: Secondary | ICD-10-CM | POA: Diagnosis not present

## 2021-07-29 DIAGNOSIS — E785 Hyperlipidemia, unspecified: Secondary | ICD-10-CM | POA: Diagnosis not present

## 2021-07-29 LAB — LIPID PANEL
Chol/HDL Ratio: 2.7 ratio (ref 0.0–4.4)
Cholesterol, Total: 150 mg/dL (ref 100–199)
HDL: 56 mg/dL (ref >39)
LDL Chol Calc (NIH): 79 mg/dL (ref 0–99)
Triglycerides: 75 mg/dL (ref 0–149)
VLDL Cholesterol Cal: 15 mg/dL (ref 5–40)

## 2021-08-11 DIAGNOSIS — E785 Hyperlipidemia, unspecified: Secondary | ICD-10-CM

## 2021-08-11 MED ORDER — ROSUVASTATIN CALCIUM 20 MG PO TABS: 20.0000 mg | ORAL_TABLET | Freq: Every day | ORAL | 3 refills | Status: DC

## 2021-08-11 NOTE — Telephone Encounter (Signed)
Please advise on when you would like this patient to have labs done again!   Thx, Sigmund Morera!

## 2021-08-22 DIAGNOSIS — H35033 Hypertensive retinopathy, bilateral: Secondary | ICD-10-CM | POA: Diagnosis not present

## 2021-08-24 ENCOUNTER — Encounter (INDEPENDENT_AMBULATORY_CARE_PROVIDER_SITE_OTHER): Payer: Self-pay | Admitting: Ophthalmology

## 2021-08-24 ENCOUNTER — Encounter (INDEPENDENT_AMBULATORY_CARE_PROVIDER_SITE_OTHER): Payer: BC Managed Care – PPO | Admitting: Ophthalmology

## 2021-08-24 ENCOUNTER — Other Ambulatory Visit: Payer: Self-pay

## 2021-08-24 DIAGNOSIS — H348322 Tributary (branch) retinal vein occlusion, left eye, stable: Secondary | ICD-10-CM | POA: Diagnosis not present

## 2021-08-24 DIAGNOSIS — D3131 Benign neoplasm of right choroid: Secondary | ICD-10-CM | POA: Diagnosis not present

## 2021-08-24 DIAGNOSIS — H43813 Vitreous degeneration, bilateral: Secondary | ICD-10-CM

## 2021-09-09 ENCOUNTER — Other Ambulatory Visit: Payer: Self-pay | Admitting: Obstetrics and Gynecology

## 2021-09-09 DIAGNOSIS — Z1231 Encounter for screening mammogram for malignant neoplasm of breast: Secondary | ICD-10-CM

## 2021-09-13 DIAGNOSIS — Z833 Family history of diabetes mellitus: Secondary | ICD-10-CM | POA: Diagnosis not present

## 2021-09-13 DIAGNOSIS — I2510 Atherosclerotic heart disease of native coronary artery without angina pectoris: Secondary | ICD-10-CM | POA: Diagnosis not present

## 2021-09-13 DIAGNOSIS — E7800 Pure hypercholesterolemia, unspecified: Secondary | ICD-10-CM | POA: Diagnosis not present

## 2021-09-13 DIAGNOSIS — H3562 Retinal hemorrhage, left eye: Secondary | ICD-10-CM | POA: Diagnosis not present

## 2021-09-23 ENCOUNTER — Encounter (INDEPENDENT_AMBULATORY_CARE_PROVIDER_SITE_OTHER): Payer: BC Managed Care – PPO | Admitting: Ophthalmology

## 2021-09-23 ENCOUNTER — Other Ambulatory Visit: Payer: Self-pay

## 2021-09-23 DIAGNOSIS — H348322 Tributary (branch) retinal vein occlusion, left eye, stable: Secondary | ICD-10-CM | POA: Diagnosis not present

## 2021-09-23 DIAGNOSIS — H43813 Vitreous degeneration, bilateral: Secondary | ICD-10-CM | POA: Diagnosis not present

## 2021-09-23 DIAGNOSIS — H2513 Age-related nuclear cataract, bilateral: Secondary | ICD-10-CM

## 2021-09-23 DIAGNOSIS — D3131 Benign neoplasm of right choroid: Secondary | ICD-10-CM

## 2021-09-30 ENCOUNTER — Encounter (INDEPENDENT_AMBULATORY_CARE_PROVIDER_SITE_OTHER): Payer: BC Managed Care – PPO | Admitting: Ophthalmology

## 2021-10-13 ENCOUNTER — Other Ambulatory Visit: Payer: Self-pay | Admitting: Cardiovascular Disease

## 2021-10-13 DIAGNOSIS — E785 Hyperlipidemia, unspecified: Secondary | ICD-10-CM

## 2021-10-15 ENCOUNTER — Other Ambulatory Visit: Payer: Self-pay | Admitting: Cardiology

## 2021-10-15 DIAGNOSIS — E785 Hyperlipidemia, unspecified: Secondary | ICD-10-CM

## 2021-10-21 ENCOUNTER — Telehealth: Payer: Self-pay | Admitting: Cardiology

## 2021-10-21 NOTE — Telephone Encounter (Signed)
Patient calling to see when she should be seen for a follow up. She was not sure if she would need a yearly in march or if she should be seen sooner.

## 2021-10-21 NOTE — Telephone Encounter (Signed)
Spoke with the patient. She made a follow up appointment with Dr. Percival Spanish in March which would be a yearly follow up. She has been advised to keep that appointment.

## 2021-11-11 ENCOUNTER — Ambulatory Visit: Payer: BC Managed Care – PPO

## 2021-11-18 ENCOUNTER — Ambulatory Visit: Payer: BC Managed Care – PPO

## 2021-12-02 ENCOUNTER — Ambulatory Visit
Admission: RE | Admit: 2021-12-02 | Discharge: 2021-12-02 | Disposition: A | Discharge status: Home / Self Care | Payer: BC Managed Care – PPO | Source: Ambulatory Visit | Attending: Obstetrics and Gynecology | Admitting: Obstetrics and Gynecology

## 2021-12-02 ENCOUNTER — Other Ambulatory Visit: Payer: Self-pay

## 2021-12-02 DIAGNOSIS — Z1231 Encounter for screening mammogram for malignant neoplasm of breast: Secondary | ICD-10-CM

## 2022-01-06 ENCOUNTER — Ambulatory Visit: Payer: BC Managed Care – PPO | Admitting: Cardiology

## 2022-01-26 ENCOUNTER — Ambulatory Visit: Payer: BC Managed Care – PPO | Admitting: Cardiology

## 2022-02-03 DIAGNOSIS — Z124 Encounter for screening for malignant neoplasm of cervix: Secondary | ICD-10-CM | POA: Diagnosis not present

## 2022-02-03 DIAGNOSIS — Z01419 Encounter for gynecological examination (general) (routine) without abnormal findings: Secondary | ICD-10-CM | POA: Diagnosis not present

## 2022-02-03 DIAGNOSIS — Z6825 Body mass index (BMI) 25.0-25.9, adult: Secondary | ICD-10-CM | POA: Diagnosis not present

## 2022-02-03 DIAGNOSIS — Z1151 Encounter for screening for human papillomavirus (HPV): Secondary | ICD-10-CM | POA: Diagnosis not present

## 2022-02-09 DIAGNOSIS — I2510 Atherosclerotic heart disease of native coronary artery without angina pectoris: Secondary | ICD-10-CM | POA: Insufficient documentation

## 2022-02-09 DIAGNOSIS — E785 Hyperlipidemia, unspecified: Secondary | ICD-10-CM | POA: Insufficient documentation

## 2022-02-09 NOTE — Progress Notes (Signed)
?  ?Cardiology Office Note ? ? ?Date:  02/10/2022  ? ?ID:  Katelyn Cooke, DOB 1966/01/01, MRN [id number] ? ?PCP:  Kelton Pillar, MD ?Cardiologist:   None ?Referring:  Self ? ?Chief Complaint  ?Patient presents with  ? Coronary Artery Disease  ? ? ?  ?History of Present Illness: ?Katelyn Cooke is a 56 y.o. female who is self referred for evaluation of chest pain.  I sent her for a coronary calcium score which she had yesterday ad her score was 47.9 but this was at the 91st percentile for her age and gender.    She had mild proximal LAD stenosis.   ? ?She returns for 1 year follow-up.  Since I saw her her mom died of a heart attack.  She has been appropriately morning.  She does exercise routinely.  The patient denies any new symptoms such as chest discomfort, neck or arm discomfort. There has been no new shortness of breath, PND or orthopnea. There have been no reported palpitations, presyncope or syncope.   She apparently had some retinal hemorrhages.  These have been managed and seem to have resolved.  There was a question of whether it was hypertension but her blood pressure has been well controlled. ? ? ?Past Medical History:  ?Diagnosis Date  ? Carotid artery occlusion   ? Essential tremor   ? Hyperlipemia   ? ? ?Past Surgical History:  ?Procedure Laterality Date  ? EYE SURGERY    ? ? ? ?Current Outpatient Medications  ?Medication Sig Dispense Refill  ? clonazePAM (KLONOPIN) 0.5 MG tablet Take 0.5 mg by mouth.    ? EPINEPHrine 0.3 mg/0.3 mL IJ SOAJ injection See admin instructions.    ? rosuvastatin (CRESTOR) 20 MG tablet Take 1 tablet (20 mg total) by mouth daily. Patient is due for an appointment, please call our office to schedule to ensure further refills attempt#1 30 tablet 0  ? zolpidem (AMBIEN) 10 MG tablet Take 10 mg by mouth.    ? ?No current facility-administered medications for this visit.  ? ? ?Allergies:   Sulfa antibiotics, Codeine, Penicillins, and Zithromax [azithromycin]  ? ?ROS:  Please  see the history of present illness.   Otherwise, review of systems are positive none.   All other systems are reviewed and negative.  ? ? ?PHYSICAL EXAM: ?VS:  BP 114/72 (BP Location: Left Arm, Patient Position: Sitting, Cuff Size: Normal)   Pulse 93   Ht '5\' 1"'$  (1.549 m)   Wt 133 lb (60.3 kg)   BMI 25.13 kg/m?  , BMI Body mass index is 25.13 kg/m?. ?GENERAL:  Well appearing ?NECK:  No jugular venous distention, waveform within normal limits, carotid upstroke brisk and symmetric, no bruits, no thyromegaly ?LUNGS:  Clear to auscultation bilaterally ?CHEST:  Unremarkable ?HEART:  PMI not displaced or sustained,S1 and S2 within normal limits, no S3, no S4, no clicks, no rubs, no murmurs ?ABD:  Flat, positive bowel sounds normal in frequency in pitch, no bruits, no rebound, no guarding, no midline pulsatile mass, no hepatomegaly, no splenomegaly ?EXT:  2 plus pulses throughout, no edema, no cyanosis no clubbing ? ? ?EKG:  EKG is  ordered today. ?The ekg ordered today demonstrates sinus rhythm, rate 93, axis within normal limits, intervals within normal limits, poor anterior R wave progression, no acute ST-T wave changes. ? ? ?Recent Labs: ?04/15/2021: ALT 44  ? ? ?Lipid Panel ?   ?Component Value Date/Time  ? CHOL 150 07/29/2021 1007  ? TRIG  75 07/29/2021 1007  ? HDL 56 07/29/2021 1007  ? CHOLHDL 2.7 07/29/2021 1007  ? [place] 79 07/29/2021 1007  ? ?  ? ?Wt Readings from Last 3 Encounters:  ?02/10/22 133 lb (60.3 kg)  ?12/17/20 131 lb 12.8 oz (59.8 kg)  ?07/30/20 134 lb (60.8 kg)  ?  ? ? ?Other studies Reviewed: ?Additional studies/ records that were reviewed today include: Labs ?Review of the above records demonstrates:  Please see elsewhere in the note.   ? ? ?ASSESSMENT AND PLAN: ? ?CAD:    She has no new symptoms.  We are going to participate in aggressive primary risk reduction. ? ?HTN: Her blood pressure is controlled.  No change in therapy.   ? ?DYSLIPIDEMIA: Her LDL was 79 which is down from 185 a year ago.   I  am going to repeat a lipid profile today and the goal is going to be LDLs in the 50s.  ? ? ?Current medicines are reviewed at length with the patient today.  The patient does not have concerns regarding medicines. ? ?The following changes have been made: None ? ?Labs/ tests ordered today include:  ? ?Orders Placed This Encounter  ?Procedures  ? Lipid panel  ? EKG 12-Lead  ? ? ? ?Disposition:   FU with me 1 year ? ? ?Signed, ?Minus Breeding, MD  ?02/10/2022 3:16 PM    ?[place] ? ? ? ?

## 2022-02-10 ENCOUNTER — Ambulatory Visit: Payer: BC Managed Care – PPO | Admitting: Cardiology

## 2022-02-10 ENCOUNTER — Encounter: Payer: Self-pay | Admitting: Cardiology

## 2022-02-10 VITALS — BP 114/72 | HR 93 | Ht 61.0 in | Wt 133.0 lb

## 2022-02-10 DIAGNOSIS — I10 Essential (primary) hypertension: Secondary | ICD-10-CM | POA: Diagnosis not present

## 2022-02-10 DIAGNOSIS — I2510 Atherosclerotic heart disease of native coronary artery without angina pectoris: Secondary | ICD-10-CM | POA: Diagnosis not present

## 2022-02-10 DIAGNOSIS — E785 Hyperlipidemia, unspecified: Secondary | ICD-10-CM

## 2022-02-10 NOTE — Patient Instructions (Signed)
Medication Instructions:  ?Continue same medications ?*If you need a refill on your cardiac medications before your next appointment, please call your pharmacy* ? ? ?Lab Work: ?Lipid Panel today ? ? ? ?Testing/Procedures: ?None ordered ? ? ?Follow-Up: ?At [facility], you and your health needs are our priority.  As part of our continuing mission to provide you with exceptional heart care, we have created designated Provider Care Teams.  These Care Teams include your primary Cardiologist (physician) and Advanced Practice Providers (APPs -  Physician Assistants and Nurse Practitioners) who all work together to provide you with the care you need, when you need it. ? ?We recommend signing up for the patient portal called "MyChart".  Sign up information is provided on this After Visit Summary.  MyChart is used to connect with patients for Virtual Visits (Telemedicine).  Patients are able to view lab/test results, encounter notes, upcoming appointments, etc.  Non-urgent messages can be sent to your provider as well.   ?To learn more about what you can do with MyChart, go to [email or web address].   ? ?Your next appointment:  1 year   ?  ? ?The format for your next appointment: Office  ? ? ?Provider:  Dr.Hochrein ?{ ? ? ? ?Important Information About Sugar ? ? ? ? ? ? ?

## 2022-02-11 LAB — LIPID PANEL
Chol/HDL Ratio: 2.6 ratio (ref 0.0–4.4)
Cholesterol, Total: 170 mg/dL (ref 100–199)
HDL: 66 mg/dL (ref >39)
LDL Chol Calc (NIH): 85 mg/dL (ref 0–99)
Triglycerides: 107 mg/dL (ref 0–149)
VLDL Cholesterol Cal: 19 mg/dL (ref 5–40)

## 2022-02-13 ENCOUNTER — Encounter: Payer: Self-pay | Admitting: *Deleted

## 2022-02-13 ENCOUNTER — Encounter: Payer: Self-pay | Admitting: Cardiology

## 2022-02-13 DIAGNOSIS — E785 Hyperlipidemia, unspecified: Secondary | ICD-10-CM

## 2022-02-13 MED ORDER — ROSUVASTATIN CALCIUM 40 MG PO TABS: 40.0000 mg | ORAL_TABLET | Freq: Every day | ORAL | 3 refills | Status: DC

## 2022-02-13 NOTE — Progress Notes (Signed)
This encounter was created in error - please disregard.

## 2022-03-17 ENCOUNTER — Encounter (INDEPENDENT_AMBULATORY_CARE_PROVIDER_SITE_OTHER): Payer: BC Managed Care – PPO | Admitting: Ophthalmology

## 2022-05-10 ENCOUNTER — Encounter: Payer: Self-pay | Admitting: Cardiology

## 2022-05-10 DIAGNOSIS — E785 Hyperlipidemia, unspecified: Secondary | ICD-10-CM

## 2022-06-01 ENCOUNTER — Encounter: Payer: Self-pay | Admitting: Cardiology

## 2022-06-02 ENCOUNTER — Encounter: Payer: Self-pay | Admitting: Cardiology

## 2022-06-02 DIAGNOSIS — E7800 Pure hypercholesterolemia, unspecified: Secondary | ICD-10-CM | POA: Diagnosis not present

## 2022-06-02 DIAGNOSIS — K582 Mixed irritable bowel syndrome: Secondary | ICD-10-CM | POA: Diagnosis not present

## 2022-06-02 DIAGNOSIS — Z0000 Encounter for general adult medical examination without abnormal findings: Secondary | ICD-10-CM | POA: Diagnosis not present

## 2022-06-02 DIAGNOSIS — Z833 Family history of diabetes mellitus: Secondary | ICD-10-CM | POA: Diagnosis not present

## 2022-06-02 DIAGNOSIS — I2510 Atherosclerotic heart disease of native coronary artery without angina pectoris: Secondary | ICD-10-CM | POA: Diagnosis not present

## 2022-06-02 DIAGNOSIS — Z23 Encounter for immunization: Secondary | ICD-10-CM | POA: Diagnosis not present

## 2022-06-05 DIAGNOSIS — L0390 Cellulitis, unspecified: Secondary | ICD-10-CM | POA: Diagnosis not present

## 2022-06-05 DIAGNOSIS — S40022A Contusion of left upper arm, initial encounter: Secondary | ICD-10-CM | POA: Diagnosis not present

## 2022-06-07 ENCOUNTER — Telehealth: Payer: Self-pay | Admitting: Cardiology

## 2022-06-07 NOTE — Telephone Encounter (Signed)
Her LDL was still not quite at target.  I called her with results and I would like her to add Zetia 10 mg daily once p.o. dispense #90 with 3 refills and get a lipid profile again in 3 months

## 2022-06-08 ENCOUNTER — Telehealth: Payer: Self-pay | Admitting: *Deleted

## 2022-06-08 DIAGNOSIS — E785 Hyperlipidemia, unspecified: Secondary | ICD-10-CM

## 2022-06-08 MED ORDER — EZETIMIBE 10 MG PO TABS: 10.0000 mg | ORAL_TABLET | Freq: Every day | ORAL | 3 refills | Status: DC

## 2022-06-08 NOTE — Telephone Encounter (Signed)
-----   Message from Minus Breeding, MD sent at 06/07/2022  4:15 PM EDT ----- I called her with the results of her lipids and she is not quite at the LDL I want.  I am going to add Zetia 10 mg daily. ----- Message ----- From: Wonda Horner, CMA Sent: 06/06/2022   9:58 AM EDT To: Minus Breeding, MD; Cristopher Estimable, RN

## 2022-06-13 ENCOUNTER — Encounter: Payer: Self-pay | Admitting: Cardiology

## 2022-06-16 ENCOUNTER — Encounter (INDEPENDENT_AMBULATORY_CARE_PROVIDER_SITE_OTHER): Payer: BC Managed Care – PPO | Admitting: Ophthalmology

## 2022-06-16 DIAGNOSIS — H348322 Tributary (branch) retinal vein occlusion, left eye, stable: Secondary | ICD-10-CM | POA: Diagnosis not present

## 2022-06-16 DIAGNOSIS — H43813 Vitreous degeneration, bilateral: Secondary | ICD-10-CM

## 2022-06-16 DIAGNOSIS — D3131 Benign neoplasm of right choroid: Secondary | ICD-10-CM | POA: Diagnosis not present

## 2022-06-28 DIAGNOSIS — Z1211 Encounter for screening for malignant neoplasm of colon: Secondary | ICD-10-CM | POA: Diagnosis not present

## 2022-08-28 DIAGNOSIS — L821 Other seborrheic keratosis: Secondary | ICD-10-CM | POA: Diagnosis not present

## 2022-08-28 DIAGNOSIS — L84 Corns and callosities: Secondary | ICD-10-CM | POA: Diagnosis not present

## 2022-08-28 DIAGNOSIS — B078 Other viral warts: Secondary | ICD-10-CM | POA: Diagnosis not present

## 2022-08-28 DIAGNOSIS — D225 Melanocytic nevi of trunk: Secondary | ICD-10-CM | POA: Diagnosis not present

## 2022-09-28 DIAGNOSIS — R509 Fever, unspecified: Secondary | ICD-10-CM | POA: Diagnosis not present

## 2022-09-28 DIAGNOSIS — Z20828 Contact with and (suspected) exposure to other viral communicable diseases: Secondary | ICD-10-CM | POA: Diagnosis not present

## 2022-09-28 DIAGNOSIS — R058 Other specified cough: Secondary | ICD-10-CM | POA: Diagnosis not present

## 2022-09-28 DIAGNOSIS — R52 Pain, unspecified: Secondary | ICD-10-CM | POA: Diagnosis not present

## 2022-11-10 DIAGNOSIS — E785 Hyperlipidemia, unspecified: Secondary | ICD-10-CM | POA: Diagnosis not present

## 2022-11-10 LAB — LIPID PANEL
Chol/HDL Ratio: 2.6 ratio (ref 0.0–4.4)
Cholesterol, Total: 118 mg/dL (ref 100–199)
HDL: 46 mg/dL (ref >39)
LDL Chol Calc (NIH): 50 mg/dL (ref 0–99)
Triglycerides: 120 mg/dL (ref 0–149)
VLDL Cholesterol Cal: 22 mg/dL (ref 5–40)

## 2022-11-13 ENCOUNTER — Other Ambulatory Visit: Payer: Self-pay | Admitting: Obstetrics and Gynecology

## 2022-11-13 DIAGNOSIS — Z1231 Encounter for screening mammogram for malignant neoplasm of breast: Secondary | ICD-10-CM

## 2022-11-18 ENCOUNTER — Other Ambulatory Visit: Payer: Self-pay | Admitting: Cardiology

## 2022-11-18 DIAGNOSIS — E785 Hyperlipidemia, unspecified: Secondary | ICD-10-CM

## 2022-11-23 ENCOUNTER — Encounter: Payer: Self-pay | Admitting: Cardiology

## 2022-11-23 DIAGNOSIS — E785 Hyperlipidemia, unspecified: Secondary | ICD-10-CM

## 2022-11-23 MED ORDER — EZETIMIBE 10 MG PO TABS: 10.0000 mg | ORAL_TABLET | Freq: Every day | ORAL | 3 refills | Status: DC

## 2022-12-20 DIAGNOSIS — H18513 Endothelial corneal dystrophy, bilateral: Secondary | ICD-10-CM | POA: Diagnosis not present

## 2022-12-20 DIAGNOSIS — Z973 Presence of spectacles and contact lenses: Secondary | ICD-10-CM | POA: Diagnosis not present

## 2022-12-20 DIAGNOSIS — H5213 Myopia, bilateral: Secondary | ICD-10-CM | POA: Diagnosis not present

## 2022-12-20 DIAGNOSIS — H2513 Age-related nuclear cataract, bilateral: Secondary | ICD-10-CM | POA: Diagnosis not present

## 2022-12-20 DIAGNOSIS — H40023 Open angle with borderline findings, high risk, bilateral: Secondary | ICD-10-CM | POA: Diagnosis not present

## 2023-01-02 ENCOUNTER — Encounter: Payer: Self-pay | Admitting: Cardiology

## 2023-01-04 ENCOUNTER — Ambulatory Visit
Admission: RE | Admit: 2023-01-04 | Discharge: 2023-01-04 | Disposition: A | Discharge status: Home / Self Care | Payer: BC Managed Care – PPO | Source: Ambulatory Visit | Attending: Obstetrics and Gynecology | Admitting: Obstetrics and Gynecology

## 2023-01-04 DIAGNOSIS — Z1231 Encounter for screening mammogram for malignant neoplasm of breast: Secondary | ICD-10-CM

## 2023-02-23 DIAGNOSIS — Z6825 Body mass index (BMI) 25.0-25.9, adult: Secondary | ICD-10-CM | POA: Diagnosis not present

## 2023-02-23 DIAGNOSIS — Z124 Encounter for screening for malignant neoplasm of cervix: Secondary | ICD-10-CM | POA: Diagnosis not present

## 2023-02-23 DIAGNOSIS — Z01419 Encounter for gynecological examination (general) (routine) without abnormal findings: Secondary | ICD-10-CM | POA: Diagnosis not present

## 2023-02-23 DIAGNOSIS — Z1151 Encounter for screening for human papillomavirus (HPV): Secondary | ICD-10-CM | POA: Diagnosis not present

## 2023-03-27 ENCOUNTER — Encounter: Payer: Self-pay | Admitting: Cardiology

## 2023-03-27 DIAGNOSIS — I2510 Atherosclerotic heart disease of native coronary artery without angina pectoris: Secondary | ICD-10-CM | POA: Diagnosis not present

## 2023-03-27 DIAGNOSIS — E7800 Pure hypercholesterolemia, unspecified: Secondary | ICD-10-CM | POA: Diagnosis not present

## 2023-03-27 DIAGNOSIS — K219 Gastro-esophageal reflux disease without esophagitis: Secondary | ICD-10-CM | POA: Diagnosis not present

## 2023-03-27 DIAGNOSIS — R142 Eructation: Secondary | ICD-10-CM | POA: Diagnosis not present

## 2023-05-03 ENCOUNTER — Encounter: Payer: Self-pay | Admitting: Cardiology

## 2023-05-03 DIAGNOSIS — R072 Precordial pain: Secondary | ICD-10-CM

## 2023-05-03 DIAGNOSIS — I2510 Atherosclerotic heart disease of native coronary artery without angina pectoris: Secondary | ICD-10-CM

## 2023-05-10 NOTE — Telephone Encounter (Signed)
The patient called and I spoke with her.  She has been getting intermittent chest pain and left arm pain.  This is happening at rest.   Given this and her previous proximal non obstructive CAD the pretest probability of obstructive CAD as the cause of her current symptoms is at least moderately high.  Repeat coronary CTA is indicated.  Please scheduled.

## 2023-05-11 MED ORDER — METOPROLOL TARTRATE 100 MG PO TABS: ORAL_TABLET | ORAL | 0 refills | Status: DC

## 2023-05-11 NOTE — Addendum Note (Signed)
Addended by: Freddi Starr on: 05/11/2023 08:49 AM   Modules accepted: Orders

## 2023-05-12 ENCOUNTER — Other Ambulatory Visit: Payer: Self-pay | Admitting: Cardiology

## 2023-05-13 ENCOUNTER — Other Ambulatory Visit: Payer: Self-pay | Admitting: Cardiology

## 2023-05-14 ENCOUNTER — Other Ambulatory Visit: Payer: Self-pay | Admitting: Cardiology

## 2023-05-15 ENCOUNTER — Encounter: Payer: Self-pay | Admitting: Cardiology

## 2023-05-16 ENCOUNTER — Ambulatory Visit (HOSPITAL_COMMUNITY)
Admission: RE | Admit: 2023-05-16 | Discharge: 2023-05-16 | Disposition: A | Discharge status: Home / Self Care | Payer: BC Managed Care – PPO | Source: Ambulatory Visit | Attending: Cardiology | Admitting: Cardiology

## 2023-05-16 DIAGNOSIS — R072 Precordial pain: Secondary | ICD-10-CM | POA: Insufficient documentation

## 2023-05-16 DIAGNOSIS — I2510 Atherosclerotic heart disease of native coronary artery without angina pectoris: Secondary | ICD-10-CM | POA: Diagnosis not present

## 2023-05-16 MED ORDER — NITROGLYCERIN 0.4 MG SL SUBL
0.8000 mg | SUBLINGUAL_TABLET | Freq: Once | SUBLINGUAL | Status: AC
  Administered 2023-05-16: 0.8 mg via SUBLINGUAL

## 2023-05-16 MED ORDER — NITROGLYCERIN 0.4 MG SL SUBL
SUBLINGUAL_TABLET | SUBLINGUAL | Status: AC
  Filled 2023-05-16: qty 2

## 2023-05-16 MED ORDER — IOHEXOL 350 MG/ML SOLN
100.0000 mL | Freq: Once | INTRAVENOUS | Status: AC | PRN
  Administered 2023-05-16: 100 mL via INTRAVENOUS

## 2023-05-17 NOTE — Progress Notes (Signed)
Cardiology Office Note   Date:  05/18/2023   ID:  Katelyn Cooke, [place] [birth year], MRN [id number]  PCP:  Ollen Bowl, MD Cardiologist:   None Referring:  Self  Chief Complaint  Patient presents with   Chest Pain      History of Present Illness: Katelyn Cooke is a 57 y.o. female who is self referred for evaluation of chest pain.  I sent her for a coronary calcium score which she had yesterday ad her score was 47.9 but this was at the 91st percentile for her age and gender.   She called recently was having chest discomfort.  This started in the spring.  There is an upper epigastric discomfort but also with a chest heaviness with mild shortness of breath.  She felt its substernal and under the left breast.  Would happen sporadically.  She did take Prilosec but she did not really have any improvement with this.  She has not had any reflux or burping.  She has not had any new shortness of breath, PND or orthopnea.  She has had no new palpitations, presyncope or syncope.  She has significant risk factors with previous dyslipidemia and a family history of early onset coronary disease.  Of note she had a CT done yesterday that demonstrated less than 25% plaque in the proximal LAD and otherwise normal coronaries.  Past Medical History:  Diagnosis Date   Carotid artery occlusion    Essential tremor    Hyperlipemia     Past Surgical History:  Procedure Laterality Date   EYE SURGERY       Current Outpatient Medications  Medication Sig Dispense Refill   EPINEPHrine 0.3 mg/0.3 mL IJ SOAJ injection See admin instructions.     ezetimibe (ZETIA) 10 MG tablet Take 1 tablet (10 mg total) by mouth daily. 90 tablet 3   metoprolol tartrate (LOPRESSOR) 100 MG tablet Take 2 hours prior to CT scan 1 tablet 0   rosuvastatin (CRESTOR) 40 MG tablet Take 1 tablet (40 mg total) by mouth daily. 90 tablet 0   zolpidem (AMBIEN) 10 MG tablet Take 10 mg by mouth.     clonazePAM (KLONOPIN) 0.5 MG  tablet Take 0.5 mg by mouth. (Patient not taking: Reported on 05/18/2023)     No current facility-administered medications for this visit.    Allergies:   Sulfa antibiotics, Codeine, Penicillins, and Zithromax [azithromycin]   ROS:  Please see the history of present illness.   Otherwise, review of systems are positive none.   All other systems are reviewed and negative.    PHYSICAL EXAM: VS:  BP 123/85 (BP Location: Right Arm, Patient Position: Sitting, Cuff Size: Normal)   Pulse 70   Ht 5\' 1"  (1.549 m)   Wt 138 lb 3.2 oz (62.7 kg)   SpO2 99%   BMI 26.11 kg/m  , BMI Body mass index is 26.11 kg/m. GENERAL:  Well appearing NECK:  No jugular venous distention, waveform within normal limits, carotid upstroke brisk and symmetric, no bruits, no thyromegaly LUNGS:  Clear to auscultation bilaterally CHEST:  Unremarkable HEART:  PMI not displaced or sustained,S1 and S2 within normal limits, no S3, no S4, no clicks, no rubs, no murmurs ABD:  Flat, positive bowel sounds normal in frequency in pitch, no bruits, no rebound, no guarding, no midline pulsatile mass, no hepatomegaly, no splenomegaly EXT:  2 plus pulses throughout, no edema, no cyanosis no clubbing    EKG:  EKG is  ordered  today. The ekg ordered today demonstrates sinus rhythm, rate 70, axis within normal limits, intervals within normal limits, poor anterior R wave progression, no acute ST-T wave changes.   Recent Labs: No results found for requested labs within last 365 days.    Lipid Panel    Component Value Date/Time   CHOL 118 11/10/2022 0927   TRIG 120 11/10/2022 0927   HDL 46 11/10/2022 0927   CHOLHDL 2.6 11/10/2022 0927   LDLCALC 50 11/10/2022 0927      Wt Readings from Last 3 Encounters:  05/18/23 138 lb 3.2 oz (62.7 kg)  02/10/22 133 lb (60.3 kg)  12/17/20 131 lb 12.8 oz (59.8 kg)      Other studies Reviewed: Additional studies/ records that were reviewed today include: ,Labs Review of the above records  demonstrates:  Please see elsewhere in the note.     ASSESSMENT AND PLAN:  CAD:     She has some mild plaque as described.  She is going to continue with aggressive primary risk reduction.  I have suggested follow-up with GI for evaluation of her chest discomfort.  HTN: Her blood pressure is controlled.  No change in therapy.    DYSLIPIDEMIA: Her LDL was 15 with an HDL of 46.  Total was 118.  This is an excellent response to Crestor and Zetia.  I will be repeating her blood work today with LP(a) and liver enzymes.   CAROTID DISEASE: The patient previously had traumatic dissection and involving the left internal carotid with a small pseudoaneurysm and was to follow with Dr. Randie Heinz but she has not scheduled this follow-up.  I have suggested that she keep this follow-up.  Current medicines are reviewed at length with the patient today.  The patient does not have concerns regarding medicines.  The following changes have been made: None  Labs/ tests ordered today include:   Orders Placed This Encounter  Procedures   Comprehensive metabolic panel   Lipid panel   Lipoprotein A (LPA)   EKG 12-Lead     Disposition:   FU with me  in one year.    Signed, Rollene Rotunda, MD  05/18/2023 9:04 AM    [facility]

## 2023-05-18 ENCOUNTER — Other Ambulatory Visit: Payer: Self-pay

## 2023-05-18 ENCOUNTER — Ambulatory Visit: Payer: BC Managed Care – PPO | Attending: Cardiology | Admitting: Cardiology

## 2023-05-18 ENCOUNTER — Encounter: Payer: Self-pay | Admitting: Cardiology

## 2023-05-18 VITALS — BP 123/85 | HR 70 | Ht 61.0 in | Wt 138.2 lb

## 2023-05-18 DIAGNOSIS — I10 Essential (primary) hypertension: Secondary | ICD-10-CM | POA: Diagnosis not present

## 2023-05-18 DIAGNOSIS — E785 Hyperlipidemia, unspecified: Secondary | ICD-10-CM

## 2023-05-18 DIAGNOSIS — I2510 Atherosclerotic heart disease of native coronary artery without angina pectoris: Secondary | ICD-10-CM

## 2023-05-18 LAB — COMPREHENSIVE METABOLIC PANEL
ALT: 78 IU/L — ABNORMAL HIGH (ref 0–32)
AST: 52 IU/L — ABNORMAL HIGH (ref 0–40)
Albumin: 5.1 g/dL — ABNORMAL HIGH (ref 3.8–4.9)
Alkaline Phosphatase: 96 IU/L (ref 44–121)
BUN/Creatinine Ratio: 16 (ref 9–23)
BUN: 12 mg/dL (ref 6–24)
Bilirubin Total: <0.2 mg/dL (ref 0.0–1.2)
CO2: 25 mmol/L (ref 20–29)
Calcium: 9.6 mg/dL (ref 8.7–10.2)
Chloride: 103 mmol/L (ref 96–106)
Creatinine, Ser: 0.77 mg/dL (ref 0.57–1.00)
Globulin, Total: 2.2 g/dL (ref 1.5–4.5)
Glucose: 109 mg/dL — ABNORMAL HIGH (ref 70–99)
Potassium: 4.8 mmol/L (ref 3.5–5.2)
Sodium: 142 mmol/L (ref 134–144)
Total Protein: 7.3 g/dL (ref 6.0–8.5)
eGFR: 90 mL/min/{1.73_m2} (ref >59)

## 2023-05-18 LAB — LIPID PANEL
Chol/HDL Ratio: 2.4 ratio (ref 0.0–4.4)
Cholesterol, Total: 133 mg/dL (ref 100–199)
HDL: 56 mg/dL (ref >39)
LDL Chol Calc (NIH): 63 mg/dL (ref 0–99)
Triglycerides: 71 mg/dL (ref 0–149)
VLDL Cholesterol Cal: 14 mg/dL (ref 5–40)

## 2023-05-18 LAB — LIPOPROTEIN A (LPA)

## 2023-05-18 NOTE — Patient Instructions (Signed)
Medication Instructions:  No changes *If you need a refill on your cardiac medications before your next appointment, please call your pharmacy*   Lab Work: CMET, Lipid Panel, Lipoprotein A. If you have labs (blood work) drawn today and your tests are completely normal, you will receive your results only by: MyChart Message (if you have MyChart) OR A paper copy in the mail If you have any lab test that is abnormal or we need to change your treatment, we will call you to review the results.   Testing/Procedures: No Testing   Follow-Up: At [facility], you and your health needs are our priority.  As part of our continuing mission to provide you with exceptional heart care, we have created designated Provider Care Teams.  These Care Teams include your primary Cardiologist (physician) and Advanced Practice Providers (APPs -  Physician Assistants and Nurse Practitioners) who all work together to provide you with the care you need, when you need it.  We recommend signing up for the patient portal called "MyChart".  Sign up information is provided on this After Visit Summary.  MyChart is used to connect with patients for Virtual Visits (Telemedicine).  Patients are able to view lab/test results, encounter notes, upcoming appointments, etc.  Non-urgent messages can be sent to your provider as well.   To learn more about what you can do with MyChart, go to [email or web address].    Your next appointment:   1 year(s)  Provider:   Rollene Rotunda, MD

## 2023-05-20 ENCOUNTER — Telehealth: Payer: Self-pay | Admitting: Internal Medicine

## 2023-05-20 NOTE — Telephone Encounter (Signed)
Needs appt HQ:IONGEXBM lft and some abd pain per Dr. Antoine Poche  Dr. Adela Lank has an opening 8/12  Tomasa Rand has 8/22  If neither of those work I cam see her 8/29

## 2023-05-21 ENCOUNTER — Encounter: Payer: Self-pay | Admitting: Cardiology

## 2023-05-21 ENCOUNTER — Encounter: Payer: Self-pay | Admitting: Internal Medicine

## 2023-05-21 NOTE — Telephone Encounter (Signed)
New patient information will be mailed out to her.

## 2023-05-21 NOTE — Telephone Encounter (Signed)
Left her a detailed message that Dr Leone Payor can see her 06/07/2023 at 2:30pm and to please call me back before it gets taken.

## 2023-05-21 NOTE — Telephone Encounter (Signed)
PT called to confirm appointment for 8/29

## 2023-05-23 ENCOUNTER — Other Ambulatory Visit: Payer: Self-pay

## 2023-05-23 DIAGNOSIS — I720 Aneurysm of carotid artery: Secondary | ICD-10-CM

## 2023-06-07 ENCOUNTER — Ambulatory Visit (INDEPENDENT_AMBULATORY_CARE_PROVIDER_SITE_OTHER): Payer: BC Managed Care – PPO

## 2023-06-07 ENCOUNTER — Encounter: Payer: Self-pay | Admitting: Internal Medicine

## 2023-06-07 ENCOUNTER — Ambulatory Visit: Payer: BC Managed Care – PPO | Admitting: Internal Medicine

## 2023-06-07 VITALS — BP 124/68 | HR 75 | Ht 61.0 in | Wt 136.0 lb

## 2023-06-07 DIAGNOSIS — R748 Abnormal levels of other serum enzymes: Secondary | ICD-10-CM

## 2023-06-07 DIAGNOSIS — R1319 Other dysphagia: Secondary | ICD-10-CM

## 2023-06-07 DIAGNOSIS — R0789 Other chest pain: Secondary | ICD-10-CM

## 2023-06-07 LAB — HEPATIC FUNCTION PANEL
ALT: 28 U/L (ref 0–35)
AST: 26 U/L (ref 0–37)
Albumin: 4.7 g/dL (ref 3.5–5.2)
Alkaline Phosphatase: 81 U/L (ref 39–117)
Bilirubin, Direct: 0.1 mg/dL (ref 0.0–0.3)
Total Bilirubin: 0.4 mg/dL (ref 0.2–1.2)
Total Protein: 7.3 g/dL (ref 6.0–8.3)

## 2023-06-07 LAB — CK: Total CK: 119 U/L (ref 7–177)

## 2023-06-07 NOTE — Patient Instructions (Signed)
Your provider has requested that you go to the basement level for lab work before leaving today. Press "B" on the elevator. The lab is located at the first door on the left as you exit the elevator.  Due to recent changes in healthcare laws, you may see the results of your imaging and laboratory studies on MyChart before your provider has had a chance to review them.  We understand that in some cases there may be results that are confusing or concerning to you. Not all laboratory results come back in the same time frame and the provider may be waiting for multiple results in order to interpret others.  Please give [place] 48 hours in order for your provider to thoroughly review all the results before contacting the office for clarification of your results.   You have been scheduled for a Barium Esophogram at [facility] (1st floor of the hospital) on 06/29/2023 at 9:00am. Please arrive 30 minutes prior to your appointment for registration. Make certain not to have anything to eat or drink 3 hours prior to your test. If you need to reschedule for any reason, please contact radiology at [phone] to do so. __________________________________________________________________ A barium swallow is an examination that concentrates on views of the esophagus. This tends to be a double contrast exam (barium and two liquids which, when combined, create a gas to distend the wall of the oesophagus) or single contrast (non-ionic iodine based). The study is usually tailored to your symptoms so a good history is essential. Attention is paid during the study to the form, structure and configuration of the esophagus, looking for functional disorders (such as aspiration, dysphagia, achalasia, motility and reflux) EXAMINATION You may be asked to change into a gown, depending on the type of swallow being performed. A radiologist and radiographer will perform the procedure. The radiologist will advise you of the type of  contrast selected for your procedure and direct you during the exam. You will be asked to stand, sit or lie in several different positions and to hold a small amount of fluid in your mouth before being asked to swallow while the imaging is performed .In some instances you may be asked to swallow barium coated marshmallows to assess the motility of a solid food bolus. The exam can be recorded as a digital or video fluoroscopy procedure. POST PROCEDURE It will take 1-2 days for the barium to pass through your system. To facilitate this, it is important, unless otherwise directed, to increase your fluids for the next 24-48hrs and to resume your normal diet.  This test typically takes about 30 minutes to perform. __________________________________________________________________________________   I appreciate the opportunity to care for you. Stan Head, MD, [facility]

## 2023-06-07 NOTE — Progress Notes (Signed)
Katelyn Cooke 57 y.o. 03-Mar-1966 [id number]  Assessment & Plan:   Encounter Diagnoses  Name Primary?   Abnormal transaminases Yes   Chest wall pain    Esophageal dysphagia - rare    I do not have a unifying diagnosis at this point.  The chest pain sounds musculoskeletal.  Question costochondritis.  Abnormal transaminases could be from skeletal muscle.  Question related to statins.  I do not get a good history for biliary colic or any liver problems.  There is rare esophageal dysphagia.  Esophageal dysmotility could cause chest pain as well.   Orders Placed This Encounter  Procedures   DG ESOPHAGUS W DOUBLE CM (HD)   CK (Creatine Kinase)   Hepatic function panel   Further plans pending these results. CC: Ollen Bowl, MD Dr. Rollene Rotunda  Subjective:   Chief Complaint: Upper abdominal and chest pains.  HPI 57 year old white woman referred by Dr. Antoine Poche due to upper abdominal and chest discomfort and abnormal transaminases.  The patient relates a history of epigastric and chest pains sometimes intense in the left substernal area, maybe fleeting maybe longer feels like a spasm and can radiate up into the neck.  She seems to relate these to starting after some Botox injections which were cosmetic and were done somewhere in the neck as well as the facial area.  Also have come in the setting of weaning off Klonopin which was taken for essential tremor.  She saw Dr. Antoine Poche and had a cardiac workup.  She also saw primary care and was treated for reflux wondering if that was the cause of this with 40 mg of omeprazole daily for a month or so and then 80 mg daily and it had no effect.  Symptoms can occur with pressure or movement.  They are not related to eating.  She is able to exercise without difficulty no dyspnea on exertion etc. playing pickleball tennis and walking.  She does not tend to get the pains then.  She does have a history of some IBS sounds like diarrhea  predominant that is better on a probiotic.  She feels bubbly at times and gurgly and she has tried reducing red wine and other alcohol and carbonated beverages but that did not make a difference in any of these upper abdominal chest symptoms.  She does not have classic heartburn symptoms.  She has had rare episodes of impact dysphagia in her life about 6 or 7 times she thinks in the last 1 about 3 years ago.  She has had to regurgitate food boluses.  She has never had an endoscopy or colonoscopy she did a Cologuard last year that was negative.  Cardiac workup included a CT coronary morphology study that was without abnormality in the noncardiac areas she did have minimal mixed nonobstructive coronary artery disease and a calcium score of 82.7.  Study of 05/16/2023.   She also has a history of a traumatic left internal carotid dissection related to toothbrush injury in the throat area and has a small possible pseudoaneurysm and is due for follow-up with Dr. Lemar Livings for this. Allergies  Allergen Reactions   Sulfa Antibiotics Other (See Comments)   Codeine Nausea Only   Penicillins Rash   Zithromax [Azithromycin] Rash   Current Meds  Medication Sig   EPINEPHrine 0.3 mg/0.3 mL IJ SOAJ injection See admin instructions.   ezetimibe (ZETIA) 10 MG tablet Take 1 tablet (10 mg total) by mouth daily.   rosuvastatin (CRESTOR) 40  MG tablet Take 1 tablet (40 mg total) by mouth daily.   zolpidem (AMBIEN) 10 MG tablet Take 5 mg by mouth.   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  She has a significant other.   Former smoker, no drug  use.  Up to 10 alcoholic beverages a week on average.   family history includes CAD (age of onset: 40) in her mother; Colon polyps in her father and mother; Diabetes in her father; Heart disease in her mother; Inflammatory bowel disease in her mother; Kidney disease in her father; Stroke (age of onset: 66) in her father.   Review of Systems As per HPI otherwise negative.  Objective:   Physical Exam @BP  124/68   Pulse 75   Ht 5\' 1"  (1.549 m)   Wt 136 lb (61.7 kg)   BMI 25.70 kg/m @  General:  Well-developed, well-nourished and in no acute distress Eyes:  anicteric. Neck:   supple w/o thyromegaly or mass.  Lungs: Clear to auscultation bilaterally. Chest wall - mildly tender sternal area Heart:   S1S2, no rubs, murmurs, gallops. Abdomen:  soft, non-tender, no hepatosplenomegaly, hernia, or mass and BS+.  Lymph:  no cervical or supraclavicular adenopathy. Extremities:   no edema, cyanosis or clubbing Skin   no rash. Neuro:  A&O x 3.  Psych:  appropriate mood and  Affect.   Data Reviewed: See HPI

## 2023-06-14 ENCOUNTER — Ambulatory Visit
Admission: RE | Admit: 2023-06-14 | Discharge: 2023-06-14 | Disposition: A | Discharge status: Home / Self Care | Payer: BC Managed Care – PPO | Source: Ambulatory Visit | Attending: Vascular Surgery | Admitting: Vascular Surgery

## 2023-06-14 ENCOUNTER — Other Ambulatory Visit: Payer: Self-pay | Admitting: Vascular Surgery

## 2023-06-14 DIAGNOSIS — I720 Aneurysm of carotid artery: Secondary | ICD-10-CM | POA: Diagnosis not present

## 2023-06-14 MED ORDER — IOPAMIDOL (ISOVUE-370) INJECTION 76%
500.0000 mL | Freq: Once | INTRAVENOUS | Status: AC | PRN
  Administered 2023-06-14: 75 mL via INTRAVENOUS

## 2023-06-26 ENCOUNTER — Encounter: Payer: Self-pay | Admitting: Vascular Surgery

## 2023-06-27 ENCOUNTER — Encounter: Payer: Self-pay | Admitting: Vascular Surgery

## 2023-06-27 ENCOUNTER — Ambulatory Visit: Payer: BC Managed Care – PPO | Admitting: Vascular Surgery

## 2023-06-27 VITALS — BP 131/82 | HR 81 | Temp 98.3°F | Resp 20 | Ht 61.0 in | Wt 132.0 lb

## 2023-06-27 DIAGNOSIS — I720 Aneurysm of carotid artery: Secondary | ICD-10-CM

## 2023-06-27 NOTE — Progress Notes (Signed)
Patient ID: Katelyn Cooke, female   DOB: 1966-08-25, 57 y.o.   MRN: [id number]  Reason for Consult: Follow-up   Referred by Ollen Bowl, MD  Subjective:     HPI:  Katelyn Cooke is a 57 y.o. female well-known to me with a history of an accidental to pressure injury with concern for carotid dissection in the past.  She remains asymptomatic without a history of stroke, TIA or amaurosis and she is a non-smoker without any other vascular disease.  More recently she has been dealing with left-sided chest pain radiating down her left arm and is undergoing cardiac workup but she thinks it may be musculoskeletal in nature.  She has no left neck or face pain.  She remains on a statin.  Past Medical History:  Diagnosis Date   Carotid artery occlusion    Elevated cholesterol    Essential tremor    GERD (gastroesophageal reflux disease)    Hyperlipemia    IBS (irritable bowel syndrome)    UTI (urinary tract infection)    Family History  Problem Relation Age of Onset   CAD Mother 40       Stent   Heart disease Mother    Inflammatory bowel disease Mother    Colon polyps Mother    Stroke Father 78   Diabetes Father    Colon polyps Father    Kidney disease Father    Past Surgical History:  Procedure Laterality Date   EYE SURGERY      Short Social History:  Social History   Tobacco Use   Smoking status: Former   Smokeless tobacco: Never   Tobacco comments:    quit smoking 5 years ago  Substance Use Topics   Alcohol use: Yes    Alcohol/week: 10.0 standard drinks of alcohol    Types: 10 Standard drinks or equivalent per week    Allergies  Allergen Reactions   Sulfa Antibiotics Other (See Comments)   Codeine Nausea Only   Penicillins Rash   Zithromax [Azithromycin] Rash    Current Outpatient Medications  Medication Sig Dispense Refill   EPINEPHrine 0.3 mg/0.3 mL IJ SOAJ injection See admin instructions.     ezetimibe (ZETIA) 10 MG tablet Take 1 tablet (10 mg  total) by mouth daily. 90 tablet 3   rosuvastatin (CRESTOR) 40 MG tablet Take 1 tablet (40 mg total) by mouth daily. 90 tablet 0   zolpidem (AMBIEN) 10 MG tablet Take 5 mg by mouth.     No current facility-administered medications for this visit.    Review of Systems  Constitutional:  Constitutional negative. HENT: HENT negative.  Eyes: Eyes negative.  Cardiovascular: Positive for chest tightness.  GI: Gastrointestinal negative.  Musculoskeletal: Musculoskeletal negative.  Skin: Skin negative.  Neurological: Neurological negative. Hematologic: Hematologic/lymphatic negative.  Psychiatric: Psychiatric negative.        Objective:  Objective   Vitals:   06/27/23 1529 06/27/23 1533  BP: 129/88 131/82  Pulse: 81   Resp: 20   Temp: 98.3 F (36.8 C)   SpO2: 97%   Weight: 132 lb (59.9 kg)   Height: 5\' 1"  (1.549 m)    Body mass index is 24.94 kg/m.  Physical Exam HENT:     Head: Normocephalic.     Nose: Nose normal.  Eyes:     Pupils: Pupils are equal, round, and reactive to light.  Cardiovascular:     Rate and Rhythm: Normal rate.  Pulmonary:     Effort: Pulmonary  effort is normal.  Abdominal:     General: Abdomen is flat.     Palpations: Abdomen is soft.  Musculoskeletal:        General: Normal range of motion.     Cervical back: No tenderness.     Right lower leg: No edema.     Left lower leg: No edema.  Skin:    General: Skin is warm.     Capillary Refill: Capillary refill takes less than 2 seconds.  Neurological:     General: No focal deficit present.     Mental Status: She is alert.  Psychiatric:        Mood and Affect: Mood normal.        Thought Content: Thought content normal.        Judgment: Judgment normal.     Data: CT scan images reviewed with the patient at the bedside.  The distal left cervical ICA appears normal on today's imaging  Assessment/Plan:     57 year old female with history as above.  Distal left cervical ICA actually  appears relatively normal today at the very least stable.  There is no need for any further imaging from this standpoint.  I will follow-up with the formal read when published by radiology and if there are any concerning findings I will communicate this with the patient.  All questions were answered today and she demonstrates good understanding of our conversation.     Maeola Harman MD [facility]

## 2023-06-29 ENCOUNTER — Encounter (INDEPENDENT_AMBULATORY_CARE_PROVIDER_SITE_OTHER): Payer: BC Managed Care – PPO | Admitting: Ophthalmology

## 2023-06-29 ENCOUNTER — Ambulatory Visit (HOSPITAL_COMMUNITY)
Admission: RE | Admit: 2023-06-29 | Discharge: 2023-06-29 | Disposition: A | Discharge status: Home / Self Care | Payer: BC Managed Care – PPO | Source: Ambulatory Visit | Attending: Internal Medicine | Admitting: Internal Medicine

## 2023-06-29 DIAGNOSIS — R1319 Other dysphagia: Secondary | ICD-10-CM | POA: Diagnosis not present

## 2023-06-29 DIAGNOSIS — R1310 Dysphagia, unspecified: Secondary | ICD-10-CM | POA: Diagnosis not present

## 2023-07-06 ENCOUNTER — Ambulatory Visit: Payer: BC Managed Care – PPO | Admitting: Cardiology

## 2023-08-03 ENCOUNTER — Encounter (INDEPENDENT_AMBULATORY_CARE_PROVIDER_SITE_OTHER): Payer: BC Managed Care – PPO | Admitting: Ophthalmology

## 2023-08-10 DIAGNOSIS — R7303 Prediabetes: Secondary | ICD-10-CM | POA: Diagnosis not present

## 2023-08-10 DIAGNOSIS — L659 Nonscarring hair loss, unspecified: Secondary | ICD-10-CM | POA: Diagnosis not present

## 2023-08-10 DIAGNOSIS — Z0000 Encounter for general adult medical examination without abnormal findings: Secondary | ICD-10-CM | POA: Diagnosis not present

## 2023-08-10 DIAGNOSIS — Z789 Other specified health status: Secondary | ICD-10-CM | POA: Diagnosis not present

## 2023-08-10 DIAGNOSIS — I2510 Atherosclerotic heart disease of native coronary artery without angina pectoris: Secondary | ICD-10-CM | POA: Diagnosis not present

## 2023-08-10 DIAGNOSIS — E7800 Pure hypercholesterolemia, unspecified: Secondary | ICD-10-CM | POA: Diagnosis not present

## 2023-11-26 ENCOUNTER — Other Ambulatory Visit: Payer: Self-pay

## 2023-11-26 DIAGNOSIS — E785 Hyperlipidemia, unspecified: Secondary | ICD-10-CM

## 2023-11-26 MED ORDER — EZETIMIBE 10 MG PO TABS
10.0000 mg | ORAL_TABLET | Freq: Every day | ORAL | 2 refills | Status: DC
Start: 2023-11-26 — End: 2024-06-06

## 2023-11-30 ENCOUNTER — Other Ambulatory Visit: Payer: Self-pay | Admitting: Obstetrics and Gynecology

## 2023-11-30 DIAGNOSIS — Z1231 Encounter for screening mammogram for malignant neoplasm of breast: Secondary | ICD-10-CM

## 2023-12-07 DIAGNOSIS — B078 Other viral warts: Secondary | ICD-10-CM | POA: Diagnosis not present

## 2023-12-07 DIAGNOSIS — D1801 Hemangioma of skin and subcutaneous tissue: Secondary | ICD-10-CM | POA: Diagnosis not present

## 2023-12-07 DIAGNOSIS — L821 Other seborrheic keratosis: Secondary | ICD-10-CM | POA: Diagnosis not present

## 2024-01-11 ENCOUNTER — Ambulatory Visit
Admission: RE | Admit: 2024-01-11 | Discharge: 2024-01-11 | Disposition: A | Discharge status: Home / Self Care | Payer: BC Managed Care – PPO | Source: Ambulatory Visit | Attending: Obstetrics and Gynecology | Admitting: Obstetrics and Gynecology

## 2024-01-11 DIAGNOSIS — Z1231 Encounter for screening mammogram for malignant neoplasm of breast: Secondary | ICD-10-CM | POA: Diagnosis not present

## 2024-01-16 ENCOUNTER — Other Ambulatory Visit: Payer: Self-pay | Admitting: Obstetrics and Gynecology

## 2024-01-16 DIAGNOSIS — R928 Other abnormal and inconclusive findings on diagnostic imaging of breast: Secondary | ICD-10-CM

## 2024-01-23 ENCOUNTER — Ambulatory Visit
Admission: RE | Admit: 2024-01-23 | Discharge: 2024-01-23 | Disposition: A | Discharge status: Home / Self Care | Source: Ambulatory Visit | Attending: Obstetrics and Gynecology | Admitting: Obstetrics and Gynecology

## 2024-01-23 DIAGNOSIS — R921 Mammographic calcification found on diagnostic imaging of breast: Secondary | ICD-10-CM | POA: Diagnosis not present

## 2024-01-23 DIAGNOSIS — R928 Other abnormal and inconclusive findings on diagnostic imaging of breast: Secondary | ICD-10-CM

## 2024-01-28 ENCOUNTER — Encounter

## 2024-02-29 DIAGNOSIS — Z1151 Encounter for screening for human papillomavirus (HPV): Secondary | ICD-10-CM | POA: Diagnosis not present

## 2024-02-29 DIAGNOSIS — Z01419 Encounter for gynecological examination (general) (routine) without abnormal findings: Secondary | ICD-10-CM | POA: Diagnosis not present

## 2024-02-29 DIAGNOSIS — Z6826 Body mass index (BMI) 26.0-26.9, adult: Secondary | ICD-10-CM | POA: Diagnosis not present

## 2024-02-29 DIAGNOSIS — Z124 Encounter for screening for malignant neoplasm of cervix: Secondary | ICD-10-CM | POA: Diagnosis not present

## 2024-03-22 DIAGNOSIS — W57XXXA Bitten or stung by nonvenomous insect and other nonvenomous arthropods, initial encounter: Secondary | ICD-10-CM | POA: Diagnosis not present

## 2024-03-22 DIAGNOSIS — R21 Rash and other nonspecific skin eruption: Secondary | ICD-10-CM | POA: Diagnosis not present

## 2024-06-06 ENCOUNTER — Other Ambulatory Visit: Payer: Self-pay | Admitting: Cardiology

## 2024-06-06 DIAGNOSIS — E785 Hyperlipidemia, unspecified: Secondary | ICD-10-CM

## 2024-10-21 ENCOUNTER — Other Ambulatory Visit: Payer: Self-pay | Admitting: Obstetrics and Gynecology

## 2024-10-21 DIAGNOSIS — Z1231 Encounter for screening mammogram for malignant neoplasm of breast: Secondary | ICD-10-CM

## 2025-01-16 ENCOUNTER — Ambulatory Visit
